# Patient Record
Sex: Male | Born: 1945 | Race: Black or African American | Hispanic: No | Marital: Single | Smoking: Current every day smoker
Health system: Southern US, Community
[De-identification: ages and names within clinical notes are randomized; demographics above are authoritative.]

## PROBLEM LIST (undated history)

## (undated) DIAGNOSIS — I6529 Occlusion and stenosis of unspecified carotid artery: Secondary | ICD-10-CM

## (undated) DIAGNOSIS — E785 Hyperlipidemia, unspecified: Secondary | ICD-10-CM

## (undated) DIAGNOSIS — I639 Cerebral infarction, unspecified: Secondary | ICD-10-CM

## (undated) DIAGNOSIS — I10 Essential (primary) hypertension: Secondary | ICD-10-CM

## (undated) HISTORY — DX: Hyperlipidemia, unspecified: E78.5

## (undated) HISTORY — DX: Essential (primary) hypertension: I10

## (undated) HISTORY — DX: Occlusion and stenosis of unspecified carotid artery: I65.29

## (undated) HISTORY — DX: Cerebral infarction, unspecified: I63.9

---

## 2000-08-22 ENCOUNTER — Emergency Department (HOSPITAL_COMMUNITY): Admission: EM | Admit: 2000-08-22 | Discharge: 2000-08-22 | Payer: Self-pay | Admitting: Emergency Medicine

## 2000-09-05 ENCOUNTER — Ambulatory Visit (HOSPITAL_COMMUNITY): Admission: RE | Admit: 2000-09-05 | Discharge: 2000-09-05 | Payer: Self-pay | Admitting: Surgery

## 2000-09-05 ENCOUNTER — Encounter (INDEPENDENT_AMBULATORY_CARE_PROVIDER_SITE_OTHER): Payer: Self-pay | Admitting: Specialist

## 2002-12-14 ENCOUNTER — Emergency Department (HOSPITAL_COMMUNITY): Admission: EM | Admit: 2002-12-14 | Discharge: 2002-12-14 | Payer: Self-pay | Admitting: *Deleted

## 2003-12-04 ENCOUNTER — Emergency Department (HOSPITAL_COMMUNITY): Admission: EM | Admit: 2003-12-04 | Discharge: 2003-12-04 | Payer: Self-pay | Admitting: Emergency Medicine

## 2004-03-13 ENCOUNTER — Inpatient Hospital Stay (HOSPITAL_COMMUNITY): Admission: AC | Admit: 2004-03-13 | Discharge: 2004-03-14 | Payer: Self-pay

## 2005-07-07 ENCOUNTER — Observation Stay (HOSPITAL_COMMUNITY): Admission: EM | Admit: 2005-07-07 | Discharge: 2005-07-08 | Payer: Self-pay | Admitting: Emergency Medicine

## 2005-07-07 ENCOUNTER — Ambulatory Visit: Payer: Self-pay | Admitting: Cardiology

## 2005-07-07 ENCOUNTER — Ambulatory Visit: Payer: Self-pay | Admitting: Internal Medicine

## 2005-07-07 ENCOUNTER — Encounter: Payer: Self-pay | Admitting: Cardiology

## 2007-09-19 HISTORY — PX: HERNIA REPAIR: SHX51

## 2008-08-03 ENCOUNTER — Emergency Department (HOSPITAL_COMMUNITY): Admission: EM | Admit: 2008-08-03 | Discharge: 2008-08-03 | Payer: Self-pay | Admitting: Emergency Medicine

## 2009-09-18 DIAGNOSIS — I639 Cerebral infarction, unspecified: Secondary | ICD-10-CM

## 2009-09-18 HISTORY — DX: Cerebral infarction, unspecified: I63.9

## 2010-02-16 DIAGNOSIS — I639 Cerebral infarction, unspecified: Secondary | ICD-10-CM

## 2010-02-16 HISTORY — DX: Cerebral infarction, unspecified: I63.9

## 2010-02-25 ENCOUNTER — Ambulatory Visit: Payer: Self-pay | Admitting: Cardiology

## 2010-02-25 ENCOUNTER — Observation Stay (HOSPITAL_COMMUNITY): Admission: EM | Admit: 2010-02-25 | Discharge: 2010-02-26 | Payer: Self-pay | Admitting: Emergency Medicine

## 2010-02-25 ENCOUNTER — Ambulatory Visit: Payer: Self-pay | Admitting: Vascular Surgery

## 2010-02-25 ENCOUNTER — Encounter (INDEPENDENT_AMBULATORY_CARE_PROVIDER_SITE_OTHER): Payer: Self-pay | Admitting: Internal Medicine

## 2010-02-25 LAB — CONVERTED CEMR LAB
Cholesterol: 177 mg/dL
Glucose, Bld: 131 mg/dL
HDL: 30 mg/dL
Hgb A1c MFr Bld: 6.2 %
LDL Cholesterol: 133 mg/dL
TSH: 1.602 microintl units/mL
Total CHOL/HDL Ratio: 5.9
Triglycerides: 72 mg/dL
VLDL: 14 mg/dL

## 2010-03-02 ENCOUNTER — Encounter: Payer: Self-pay | Admitting: Vascular Surgery

## 2010-03-02 ENCOUNTER — Inpatient Hospital Stay (HOSPITAL_COMMUNITY)
Admission: RE | Admit: 2010-03-02 | Discharge: 2010-03-03 | Payer: Self-pay | Source: Home / Self Care | Admitting: Vascular Surgery

## 2010-03-02 DIAGNOSIS — I6529 Occlusion and stenosis of unspecified carotid artery: Secondary | ICD-10-CM

## 2010-03-02 HISTORY — DX: Occlusion and stenosis of unspecified carotid artery: I65.29

## 2010-03-02 HISTORY — PX: CAROTID ENDARTERECTOMY: SUR193

## 2010-03-03 LAB — CONVERTED CEMR LAB
BUN: 5 mg/dL
CO2: 24 meq/L
Calcium: 8.5 mg/dL
Chloride: 108 meq/L
Creatinine, Ser: 0.63 mg/dL
Glucose, Bld: 149 mg/dL
Potassium: 4.1 meq/L
Sodium: 138 meq/L

## 2010-03-14 ENCOUNTER — Ambulatory Visit: Payer: Self-pay | Admitting: Internal Medicine

## 2010-03-14 DIAGNOSIS — F17200 Nicotine dependence, unspecified, uncomplicated: Secondary | ICD-10-CM

## 2010-03-15 ENCOUNTER — Encounter (INDEPENDENT_AMBULATORY_CARE_PROVIDER_SITE_OTHER): Payer: Self-pay | Admitting: Nurse Practitioner

## 2010-03-31 ENCOUNTER — Ambulatory Visit: Payer: Self-pay | Admitting: Vascular Surgery

## 2010-04-14 ENCOUNTER — Telehealth (INDEPENDENT_AMBULATORY_CARE_PROVIDER_SITE_OTHER): Payer: Self-pay | Admitting: Nurse Practitioner

## 2010-05-16 ENCOUNTER — Ambulatory Visit: Payer: Self-pay | Admitting: Physician Assistant

## 2010-05-16 DIAGNOSIS — E739 Lactose intolerance, unspecified: Secondary | ICD-10-CM

## 2010-05-16 DIAGNOSIS — I10 Essential (primary) hypertension: Secondary | ICD-10-CM | POA: Insufficient documentation

## 2010-05-17 DIAGNOSIS — E785 Hyperlipidemia, unspecified: Secondary | ICD-10-CM

## 2010-05-17 LAB — CONVERTED CEMR LAB
ALT: 18 units/L (ref 0–53)
AST: 26 units/L (ref 0–37)
Albumin: 3.8 g/dL (ref 3.5–5.2)
Alkaline Phosphatase: 85 units/L (ref 39–117)
BUN: 8 mg/dL (ref 6–23)
CO2: 25 meq/L (ref 19–32)
Calcium: 8.9 mg/dL (ref 8.4–10.5)
Chloride: 104 meq/L (ref 96–112)
Cholesterol: 151 mg/dL (ref 0–200)
Creatinine, Ser: 0.78 mg/dL (ref 0.40–1.50)
Glucose, Bld: 91 mg/dL (ref 70–99)
HDL: 38 mg/dL — ABNORMAL LOW (ref >39)
LDL Cholesterol: 90 mg/dL (ref 0–99)
Potassium: 4.2 meq/L (ref 3.5–5.3)
Sodium: 139 meq/L (ref 135–145)
Total Bilirubin: 0.4 mg/dL (ref 0.3–1.2)
Total CHOL/HDL Ratio: 4
Total Protein: 7.7 g/dL (ref 6.0–8.3)
Triglycerides: 114 mg/dL (ref <150)
VLDL: 23 mg/dL (ref 0–40)

## 2010-05-20 ENCOUNTER — Encounter (INDEPENDENT_AMBULATORY_CARE_PROVIDER_SITE_OTHER): Payer: Self-pay | Admitting: *Deleted

## 2010-05-20 ENCOUNTER — Encounter: Payer: Self-pay | Admitting: Physician Assistant

## 2010-05-24 ENCOUNTER — Encounter: Payer: Self-pay | Admitting: Physician Assistant

## 2010-05-24 ENCOUNTER — Ambulatory Visit: Payer: Self-pay | Admitting: Nurse Practitioner

## 2010-06-16 ENCOUNTER — Ambulatory Visit: Payer: Self-pay | Admitting: Vascular Surgery

## 2010-08-22 ENCOUNTER — Telehealth (INDEPENDENT_AMBULATORY_CARE_PROVIDER_SITE_OTHER): Payer: Self-pay | Admitting: Nurse Practitioner

## 2010-08-22 DIAGNOSIS — K029 Dental caries, unspecified: Secondary | ICD-10-CM | POA: Insufficient documentation

## 2010-08-24 ENCOUNTER — Encounter (INDEPENDENT_AMBULATORY_CARE_PROVIDER_SITE_OTHER): Payer: Self-pay | Admitting: Nurse Practitioner

## 2010-09-26 ENCOUNTER — Ambulatory Visit: Admit: 2010-09-26 | Payer: Self-pay | Admitting: Nurse Practitioner

## 2010-09-28 ENCOUNTER — Ambulatory Visit: Admit: 2010-09-28 | Payer: Self-pay | Admitting: Vascular Surgery

## 2010-09-28 ENCOUNTER — Ambulatory Visit
Admission: RE | Admit: 2010-09-28 | Discharge: 2010-09-28 | Payer: Self-pay | Source: Home / Self Care | Attending: Vascular Surgery | Admitting: Vascular Surgery

## 2010-10-08 NOTE — Procedures (Unsigned)
CAROTID DUPLEX EXAM  INDICATION:  Follow up carotid endarterectomy.  HISTORY: Diabetes:  No. Cardiac:  No. Hypertension:  Yes. Smoking:  Yes. Previous Surgery:  Right carotid endarterectomy with Dacron patch angioplasty, 03/02/2010. CV History:  CVA with left upper extremity weakness. Amaurosis Fugax No, Paresthesias No, Hemiparesis No.                                      RIGHT             LEFT Brachial systolic pressure:         162               167 Brachial Doppler waveforms:         Normal            Normal Vertebral direction of flow:        Antegrade         Antegrade DUPLEX VELOCITIES (cm/sec) CCA peak systolic                   93                107 ECA peak systolic                   109               103 ICA peak systolic                   105               78 ICA end diastolic                   37                37 PLAQUE MORPHOLOGY:                  Heterogenous      Heterogenous PLAQUE AMOUNT:                      Minimal           Minimal PLAQUE LOCATION:                    CCA               Bifurcation, ICA  IMPRESSION: 1. Right internal carotid artery shows no evidence of restenosis,     status post carotid endarterectomy. 2. Left internal carotid artery velocities suggest 1% to 39% stenosis. 3. Antegrade vertebral arteries bilaterally.  ___________________________________________ Di Kindle. Edilia Bo, M.D.  EM/MEDQ  D:  09/28/2010  T:  09/28/2010  Job:  [id number]

## 2010-10-14 ENCOUNTER — Encounter (INDEPENDENT_AMBULATORY_CARE_PROVIDER_SITE_OTHER): Payer: Self-pay | Admitting: Nurse Practitioner

## 2010-10-18 NOTE — Letter (Signed)
Summary: NUTRITION SUMMARY///SUSIE  NUTRITION SUMMARY///SUSIE   Imported By: Arta Bruce 06/13/2010 15:47:53  _____________________________________________________________________  External Attachment:    Type:   Image     Comment:   External Document

## 2010-10-18 NOTE — Letter (Signed)
Summary: FAXED REQUESTED RECORDS TO [facility]  FAXED REQUESTED RECORDS TO [facility]   Imported By: Arta Bruce 05/20/2010 12:04:03  _____________________________________________________________________  External Attachment:    Type:   Image     Comment:   External Document

## 2010-10-18 NOTE — Letter (Signed)
Summary: *HSN Results Follow up  [facility]  [address] [place], [place] [address]   Phone: [phone]  Fax: [phone]      05/20/2010   Jeremiah Garcia [address] [place], [place]  [address]   Dear  Mr. Jeremiah Garcia,                            ____S.Drinkard,FNP   ____D. Gore,FNP       ____B. McPherson,MD   ____V. Rankins,MD    ____E. Mulberry,MD    ____N. Daphine Deutscher, FNP  ____D. Reche Dixon, MD    ____K. Philipp Deputy, MD    ____Other     This letter is to inform you that your recent test(s):  _______Pap Smear    _______Lab Test     _______X-ray    _______ is within acceptable limits  _______ requires a medication change  _______ requires a follow-up lab visit  _______ requires a follow-up visit with your provider   Comments:  We have been trying to contact you at [phone].  Please contact the office at your earliest convenience.       _________________________________________________________ If you have any questions, please contact our office                     Sincerely,  Levon Hedger [facility]

## 2010-10-18 NOTE — Assessment & Plan Note (Signed)
Summary: 2 MONTH FU////KT   Vital Signs:  Patient profile:   65 year old male Height:      72.25 inches Weight:      196.9 pounds Temp:     98.1 degrees F oral Pulse rate:   80 / minute Pulse rhythm:   regular Resp:     28 per minute BP sitting:   160 / 90  (left arm) Cuff size:   regular  Vitals Entered By: Michelle Nasuti (May 16, 2010 9:09 AM) CC: follow-up visit, Hypertension Management Pain Assessment Patient in pain? no       Does patient need assistance? Functional Status Self care Ambulation Normal   Primary Care Provider:  Lehman Prom, NP  CC:  follow-up visit and Hypertension Management.  History of Present Illness: Here for follow up appt.  Dorma Russell, NP in June after admxn to [facility] for stroke and carotid stenosis.  Had Right CEA done.  NO LICA stenosis on ultrasound.  Has seen Dr. Edilia Bo.  Goes back in December for f/u carotid dopplers.  Social:  Was a Designer, fashion/clothing.  Told by surgeon to stop working.  Now applying for disability.  CVA:  Still has left hand weakness.  Notes right leg nubmness on lat aspect since CVA.  NO leg weakness.    Hypertension History:      He denies chest pain, dyspnea with exertion, and syncope.        Positive major cardiovascular risk factors include male age 49 years old or older, hyperlipidemia, hypertension, and current tobacco user.     Habits & Providers  Alcohol-Tobacco-Diet     Tobacco Status: current     Cigarette Packs/Day: 0.5  Current Medications (verified): 1)  Aspir-Low 81 Mg Tbec (Aspirin) .Marland Kitchen.. 1 By Mouth Once Daily 2)  Crestor 20 Mg Tabs (Rosuvastatin Calcium) .... One Tablet By Mouth Nightly For Cholesterol 3)  Nicotine 14 Mg/24hr Pt24 (Nicotine) .... Apply Daily To Quit Smoking  Allergies (verified): No Known Drug Allergies  Physical Exam  General:  alert, well-developed, and well-nourished.   Head:  normocephalic and atraumatic.   Neck:  supple.   Lungs:  normal breath sounds.   Heart:  normal  rate and regular rhythm.   Abdomen:  soft, non-tender, and no hepatomegaly.   Neurologic:  alert & oriented X3 and cranial nerves II-XII intact.   Psych:  normally interactive.     Impression & Recommendations:  Problem # 1:  ESSENTIAL HYPERTENSION, BENIGN (ICD-401.1)  needs treatment creatinines in hosp were ok will start Lisinopril 20 mg once daily  check BP and BMET in 2 weeks  His updated medication list for this problem includes:    Lisinopril 20 Mg Tabs (Lisinopril) .Marland Kitchen... Take 1 tablet by mouth once a day for blood pressure  Orders: T-Comprehensive Metabolic Panel ([id number])  Problem # 2:  GLUCOSE INTOLERANCE (ICD-271.3) Hgb A1C 6.2 in the hospital refer to Drucilla Schmidt for diet education  Problem # 3:  HYPERLIPIDEMIA (ICD-272.4)  His updated medication list for this problem includes:    Crestor 20 Mg Tabs (Rosuvastatin calcium) ..... One tablet by mouth nightly for cholesterol  Orders: T-Comprehensive Metabolic Panel [id number]) T-Lipid Profile ([id number])  Problem # 4:  CAROTID ARTERY STENOSIS, RIGHT (ICD-433.10) f/u with vasc surgery cont ASA  His updated medication list for this problem includes:    Aspir-low 81 Mg Tbec (Aspirin) .Marland Kitchen... 1 by mouth once daily  Complete Medication List: 1)  Aspir-low 81 Mg Tbec (Aspirin) .Marland KitchenMarland KitchenMarland Kitchen 1  by mouth once daily 2)  Crestor 20 Mg Tabs (Rosuvastatin calcium) .... One tablet by mouth nightly for cholesterol 3)  Nicotine 14 Mg/24hr Pt24 (Nicotine) .... Apply daily to quit smoking 4)  Lisinopril 20 Mg Tabs (Lisinopril) .... Take 1 tablet by mouth once a day for blood pressure  Hypertension Assessment/Plan:      The patient's hypertensive risk group is category B: At least one risk factor (excluding diabetes) with no target organ damage.  His calculated 10 year risk of coronary heart disease is 47 %.  Today's blood pressure is 160/90.    Patient Instructions: 1)  Schedule appt with Susie Piper for diet education. 2)   Return to nurse in 2 weeks for blood pressure check and BMET.  Notify provider if BP > 140/90 or < 110/60. 3)  Please schedule a follow-up appointment in 2 months with [facility] for blood pressure.  Prescriptions: LISINOPRIL 20 MG TABS (LISINOPRIL) Take 1 tablet by mouth once a day for blood pressure  #30 x 5   Entered and Authorized by:   Tereso Newcomer PA-C   Signed by:   Tereso Newcomer PA-C on 05/16/2010   Method used:   Print then Give to Patient   RxID:   [id number] CRESTOR 20 MG TABS (ROSUVASTATIN CALCIUM) One tablet by mouth nightly for cholesterol  #30 x 5   Entered and Authorized by:   Tereso Newcomer PA-C   Signed by:   Tereso Newcomer PA-C on 05/16/2010   Method used:   Faxed to ...       [facility] (retail)       [address] [place], [place]  [address]       Ph: [phone]       Fax: [phone]   RxID:   [id number]

## 2010-10-18 NOTE — Letter (Signed)
Summary: PT INFORMATION SHEET  PT INFORMATION SHEET   Imported By: Arta Bruce 04/04/2010 10:24:15  _____________________________________________________________________  External Attachment:    Type:   Image     Comment:   External Document
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
## 2010-10-18 NOTE — Progress Notes (Signed)
Summary: DENTAL PAIN/REFERRAL  Phone Note Call from Patient Call back at Home Phone [phone]   Reason for Call: Referral Summary of Call: [facility] PT. MR Witting STOPPED BY  TO GET A DENTAL REFERRAL, BECAUSE HE IS HAVING DENTAL PAIN AND HAVE ABOUT 3 TEETH THAT NEED TO BE PULLED/ Initial call taken by: Leodis Rains,  August 22, 2010 10:18 AM  Follow-up for Phone Call        forward to N. Daphine Deutscher, fnp Follow-up by: Levon Hedger,  August 23, 2010 4:20 PM  Additional Follow-up for Phone Call Additional follow up Details #1::        will refer to dental clinic (referral given to [facility]) advise pt that # on referral is the contact number that they will contact him with time/date of the appointment advise pt that there is a wait list so be advised Additional Follow-up by: Lehman Prom FNP,  August 23, 2010 4:26 PM  New Problems: DENTAL CARIES (ICD-521.00)   Additional Follow-up for Phone Call Additional follow up Details #2::    Nanci Pina REFERRAL TO [facility] DENTAL  [facility] # [id number] ADDRESS [address]  THEY WILL CONTACT THE PT TO MADE AN APPT Follow-up by: Cheryll Dessert,  August 24, 2010 10:49 AM  New Problems: DENTAL CARIES (ICD-521.00)

## 2010-10-18 NOTE — Progress Notes (Signed)
Summary: Requesting for Crestor Samples  Phone Note Call from Patient Call back at [phone]   Summary of Call: The pt had an appointment on Aug 23 to apply for elegibility but at the meantime he is wondered if the provider can provide some samples of crestor that can last until then.  Pt also applied for medicaid but he needs to wait for 45 days to get a response back.  The medication cost 80 at regular pharmacy without a card.  Please call back the pt.   ([facility] [place] [place] [facility]   Initial call taken by: Manon Hilding,  April 14, 2010 10:11 AM  Follow-up for Phone Call        Sent to N. Daphine Deutscher.  Dutch Quint RN  April 14, 2010 11:06 AM   Additional Follow-up for Phone Call Additional follow up Details #1::        ok to get crestor samples #28 document in chart Additional Follow-up by: Lehman Prom FNP,  April 14, 2010 11:35 AM    Additional Follow-up for Phone Call Additional follow up Details #2::    Unable to leave message -- no answer.  Dutch Quint RN  April 14, 2010 2:20 PM  Left message on answering machine to return call.  Dutch Quint RN  April 15, 2010 10:36 AM  Pt. received sample Crestor #28 pills.   Follow-up by: Dutch Quint RN,  April 18, 2010 10:27 AM

## 2010-10-20 NOTE — Letter (Signed)
Summary: MAILED REQUESTED RECORDS TO [facility]  MAILED REQUESTED RECORDS TO [facility]   Imported By: Arta Bruce 10/14/2010 09:44:35  _____________________________________________________________________  External Attachment:    Type:   Image     Comment:   External Document

## 2010-10-20 NOTE — Letter (Signed)
Summary: DENTAL REFERRAL  DENTAL REFERRAL   Imported By: Arta Bruce 08/30/2010 14:12:36  _____________________________________________________________________  External Attachment:    Type:   Image     Comment:   External Document

## 2010-11-16 ENCOUNTER — Encounter: Payer: Self-pay | Admitting: Nurse Practitioner

## 2010-11-16 ENCOUNTER — Encounter (INDEPENDENT_AMBULATORY_CARE_PROVIDER_SITE_OTHER): Payer: Self-pay | Admitting: Nurse Practitioner

## 2010-11-16 DIAGNOSIS — N3000 Acute cystitis without hematuria: Secondary | ICD-10-CM | POA: Insufficient documentation

## 2010-11-16 DIAGNOSIS — R05 Cough: Secondary | ICD-10-CM | POA: Insufficient documentation

## 2010-11-16 DIAGNOSIS — R059 Cough, unspecified: Secondary | ICD-10-CM | POA: Insufficient documentation

## 2010-11-16 LAB — CONVERTED CEMR LAB
ALT: 15 units/L (ref 0–53)
AST: 24 units/L (ref 0–37)
Albumin: 3.9 g/dL (ref 3.5–5.2)
Alkaline Phosphatase: 71 units/L (ref 39–117)
BUN: 9 mg/dL (ref 6–23)
Basophils Absolute: 0 10*3/uL (ref 0.0–0.1)
Basophils Relative: 0 % (ref 0–1)
Blood Glucose, Fingerstick: 193
Blood in Urine, dipstick: NEGATIVE
CO2: 25 meq/L (ref 19–32)
Calcium: 9.3 mg/dL (ref 8.4–10.5)
Chloride: 106 meq/L (ref 96–112)
Cholesterol, target level: 200 mg/dL
Creatinine, Ser: 0.79 mg/dL (ref 0.40–1.50)
Eosinophils Absolute: 0.2 10*3/uL (ref 0.0–0.7)
Eosinophils Relative: 4 % (ref 0–5)
Glucose, Bld: 108 mg/dL — ABNORMAL HIGH (ref 70–99)
Glucose, Urine, Semiquant: NEGATIVE
HCT: 44.4 % (ref 39.0–52.0)
HDL goal, serum: 40 mg/dL
Hemoglobin: 14.6 g/dL (ref 13.0–17.0)
Hgb A1c MFr Bld: 5.4 %
LDL Goal: 130 mg/dL
Lymphocytes Relative: 42 % (ref 12–46)
Lymphs Abs: 2.3 10*3/uL (ref 0.7–4.0)
MCHC: 32.9 g/dL (ref 30.0–36.0)
MCV: 86.2 fL (ref 78.0–100.0)
Microalb, Ur: 14.48 mg/dL — ABNORMAL HIGH (ref 0.00–1.89)
Monocytes Absolute: 0.6 10*3/uL (ref 0.1–1.0)
Monocytes Relative: 10 % (ref 3–12)
Neutro Abs: 2.4 10*3/uL (ref 1.7–7.7)
Neutrophils Relative %: 44 % (ref 43–77)
Nitrite: POSITIVE
OCCULT 1: NEGATIVE
PSA: 3.25 ng/mL (ref <=4.00)
Platelets: 292 10*3/uL (ref 150–400)
Potassium: 4.2 meq/L (ref 3.5–5.3)
Protein, U semiquant: NEGATIVE
RBC: 5.15 M/uL (ref 4.22–5.81)
RDW: 14.9 % (ref 11.5–15.5)
Sodium: 140 meq/L (ref 135–145)
Total Bilirubin: 0.4 mg/dL (ref 0.3–1.2)
Total Protein: 7.6 g/dL (ref 6.0–8.3)
Urobilinogen, UA: 0.2
WBC Urine, dipstick: NEGATIVE
WBC: 5.5 10*3/uL (ref 4.0–10.5)
pH: 5

## 2010-11-17 ENCOUNTER — Other Ambulatory Visit (HOSPITAL_COMMUNITY): Payer: Self-pay | Admitting: Internal Medicine

## 2010-11-17 ENCOUNTER — Telehealth (INDEPENDENT_AMBULATORY_CARE_PROVIDER_SITE_OTHER): Payer: Self-pay | Admitting: Nurse Practitioner

## 2010-11-17 ENCOUNTER — Encounter (INDEPENDENT_AMBULATORY_CARE_PROVIDER_SITE_OTHER): Payer: Self-pay | Admitting: Nurse Practitioner

## 2010-11-17 DIAGNOSIS — F17200 Nicotine dependence, unspecified, uncomplicated: Secondary | ICD-10-CM

## 2010-11-17 DIAGNOSIS — Z77090 Contact with and (suspected) exposure to asbestos: Secondary | ICD-10-CM

## 2010-11-17 DIAGNOSIS — R05 Cough: Secondary | ICD-10-CM

## 2010-11-18 ENCOUNTER — Ambulatory Visit (HOSPITAL_COMMUNITY)
Admission: RE | Admit: 2010-11-18 | Discharge: 2010-11-18 | Disposition: A | Discharge status: Home / Self Care | Payer: Medicare Other | Source: Ambulatory Visit | Attending: Internal Medicine | Admitting: Internal Medicine

## 2010-11-18 ENCOUNTER — Other Ambulatory Visit (HOSPITAL_COMMUNITY): Payer: Self-pay | Admitting: Internal Medicine

## 2010-11-18 DIAGNOSIS — R05 Cough: Secondary | ICD-10-CM

## 2010-11-18 DIAGNOSIS — J438 Other emphysema: Secondary | ICD-10-CM | POA: Insufficient documentation

## 2010-11-18 DIAGNOSIS — Z77090 Contact with and (suspected) exposure to asbestos: Secondary | ICD-10-CM | POA: Insufficient documentation

## 2010-11-18 DIAGNOSIS — R059 Cough, unspecified: Secondary | ICD-10-CM | POA: Insufficient documentation

## 2010-11-18 DIAGNOSIS — F17200 Nicotine dependence, unspecified, uncomplicated: Secondary | ICD-10-CM

## 2010-11-18 MED ORDER — IOHEXOL 300 MG/ML  SOLN
80.0000 mL | Freq: Once | INTRAMUSCULAR | Status: AC | PRN
  Administered 2010-11-18: 80 mL via INTRAVENOUS

## 2010-11-24 NOTE — Letter (Signed)
Summary: TEST ORDER FORM  TEST ORDER FORM   Imported By: Arta Bruce 11/18/2010 12:12:21  _____________________________________________________________________  External Attachment:    Type:   Image     Comment:   External Document

## 2010-11-24 NOTE — Letter (Signed)
Summary: TEST ORDER FORM  TEST ORDER FORM   Imported By: Arta Bruce 11/17/2010 12:04:40  _____________________________________________________________________  External Attachment:    Type:   Image     Comment:   External Document

## 2010-11-24 NOTE — Progress Notes (Signed)
Summary: CT Order  Phone Note Outgoing Call   Summary of Call: Pt scheduled a CT during visit on 11/16/2010 order sheet sent up front  Initial call taken by: Lehman Prom FNP,  November 17, 2010 8:48 AM  Follow-up for Phone Call        Pt  sched for 11-18-10 @ 10:30am. @ [facility].Marland KitchenPt aware of appt.Candi Leash  November 17, 2010 2:13 PM  Follow-up by: Candi Leash,  November 17, 2010 2:13 PM

## 2010-11-24 NOTE — Assessment & Plan Note (Signed)
Summary: HTN   Vital Signs:  Patient profile:   65 year old male Weight:      189.9 pounds BMI:     25.67 Temp:     97.9 degrees F oral Pulse rate:   76 / minute Pulse rhythm:   regular Resp:     20 per minute BP sitting:   130 / 77  (left arm) Cuff size:   regular  Vitals Entered By: Levon Hedger (November 16, 2010 11:00 AM)  Nutrition Counseling: Patient's BMI is greater than 25 and therefore counseled on weight management options. CC: follow-up visit HTN, Hypertension Management, Lipid Management Is Patient Diabetic? No Pain Assessment Patient in pain? no      CBG Result 193  Does patient need assistance? Functional Status Self care Ambulation Normal   Primary Care Provider:  Lehman Prom, NP  CC:  follow-up visit HTN, Hypertension Management, and Lipid Management.  History of Present Illness:  Pt into the office to f/u on htn.  Pt did not bring his medications into the office but he is able to recall.    Hypertension History:      He denies headache, chest pain, and palpitations.  He notes no problems with any antihypertensive medication side effects.  pt is taking meds as ordered.        Positive major cardiovascular risk factors include male age 62 years old or older, hyperlipidemia, hypertension, and current tobacco user.        Further assessment for target organ damage reveals no history of ASHD or stroke/TIA.    Lipid Management History:      Positive NCEP/ATP III risk factors include male age 32 years old or older, HDL cholesterol less than 40, current tobacco user, and hypertension.  Negative NCEP/ATP III risk factors include no ASHD (atherosclerotic heart disease) and no prior stroke/TIA.      Habits & Providers  Alcohol-Tobacco-Diet     Alcohol drinks/day: 0     Tobacco Status: current     Tobacco Counseling: to quit use of tobacco products     Cigarette Packs/Day: 0.5     Year Started: age 1  Exercise-Depression-Behavior     Does  Patient Exercise: no     Have you felt down or hopeless? no     Have you felt little pleasure in things? no     Depression Counseling: not indicated; screening negative for depression     Drug Use: never  Allergies (verified): No Known Drug Allergies  Social History: Does Patient Exercise:  no  Review of Systems General:  Denies loss of appetite. CV:  Denies chest pain or discomfort. 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  Pt has been a chronic smoker. GI:  Denies abdominal pain, nausea, and vomiting. GU:  Denies dysuria and hematuria.  Physical Exam  General:  alert.   Head:  normocephalic.   Mouth:  some teeth missing.   Lungs:  normal breath sounds.   Heart:  normal rate and regular rhythm.   Abdomen:  normal bowel sounds.   Prostate:  no gland enlargement.   Msk:  up to the exam Neurologic:  alert & oriented X3.   Skin:  color normal.   Psych:  Oriented X3.     Impression & Recommendations:  Problem # 1:  ESSENTIAL HYPERTENSION, BENIGN (ICD-401.1)  BP is doing well DASH diet pt  to continue current meds His updated medication list for this problem includes:    Lisinopril 20 Mg Tabs (Lisinopril) .Marland Kitchen... Take 1 tablet by mouth once a day for blood pressure  Orders: T-CBC w/Diff (04540-98119) T-Comprehensive Metabolic Panel (14782-95621) T-Urine Microalbumin w/creat. ratio [phone]) UA Dipstick w/o Micro (manual) (28413)  Problem # 2:  HYPERLIPIDEMIA (ICD-272.4)  His updated medication list for this problem includes:    Crestor 20 Mg Tabs (Rosuvastatin calcium) ..... One tablet by mouth nightly for cholesterol  Problem # 3:  GLUCOSE INTOLERANCE (ICD-271.3)  Orders: Capillary Blood Glucose/CBG (82948) Hemoglobin A1C (24401)  Problem # 4:  TOBACCO ABUSE (ICD-305.1)  advised pt to quit smoking The following medications were removed from the  medication list:    Nicotine 14 Mg/24hr Pt24 (Nicotine) .Marland Kitchen... Apply daily to quit smoking  Orders: CT with & without Contrast (CT w&w/o Contrast)  Problem # 5:  SPECIAL SCREENING MALIGNANT NEOPLASM OF PROSTATE (ICD-V76.44)  Orders: T-PSA (02725-36644)  Complete Medication List: 1)  Aspir-low 81 Mg Tbec (Aspirin) .Marland Kitchen.. 1 by mouth once daily 2)  Crestor 20 Mg Tabs (Rosuvastatin calcium) .... One tablet by mouth nightly for cholesterol 3)  Lisinopril 20 Mg Tabs (Lisinopril) .... Take 1 tablet by mouth once a day for blood pressure 4)  Fish Oil 1000 Mg Caps (Omega-3 fatty acids) .... Take 1 capsule by mouth once a day 5)  Macrobid 100 Mg Caps (Nitrofurantoin monohyd macro) .... One capsule by mouth two times a day for infection  Other Orders: T-Culture, Urine (03474-25956)  Hypertension Assessment/Plan:      The patient's hypertensive risk group is category B: At least one risk factor (excluding diabetes) with no target organ damage.  His calculated 10 year risk of coronary heart disease is 18 %.  Today's blood pressure is 130/77.  His blood pressure goal is < 140/90.  Lipid Assessment/Plan:      Based on NCEP/ATP III, the patient's risk factor category is "0-1 risk factors".  The patient's lipid goals are as follows: Total cholesterol goal is 200; LDL cholesterol goal is 130; HDL cholesterol goal is 40; Triglyceride goal is 150.    Patient Instructions: 1)  Blood pressure - doing well. Keep taking medications as ordered 2)  Prostate exam - your labs will be checked today and you will be notified of the results 3)  No pneumovax present today in office.  You will need to get on the next visit 4)  You will be scheduled for a Chest CT and called to schedule the appt. 5)  You have infection in your urine. It will be sent to the lab for culture. 6)  Follow up in 6 months for high blood pressure. Prescriptions: MACROBID 100 MG CAPS (NITROFURANTOIN MONOHYD MACRO) One capsule by mouth two times  a day for infection  #14 x 0   Entered and Authorized by:   Lehman Prom FNP   Signed by:   Lehman Prom FNP on 11/16/2010   Method used:   Print then Give to Patient   RxID:   [id number] MACROBID 100 MG CAPS (NITROFURANTOIN MONOHYD MACRO) One capsule by mouth two times a day for infection  #14 x 0   Entered and Authorized by:   Lehman Prom FNP   Signed by:   Lehman Prom FNP on 11/16/2010   Method used:   Print then Give to Patient   RxID:   [id number]    Orders Added: 1)  Capillary Blood Glucose/CBG [57322] 2)  Est.  Patient Level IV [99214] 3)  T-PSA [[address]-60454] 4)  T-CBC w/Diff [09811-91478] 5)  T-Comprehensive Metabolic Panel [80053-22900] 6)  T-Urine Microalbumin w/creat. ratio [82043-[address]] 7)  UA Dipstick w/o Micro (manual) [81002] 8)  Hemoglobin A1C [83036] 9)  T-Culture, Urine [29562-13086] 10)  CT with & without Contrast [CT w&w/o Contrast]    Prevention & Chronic Care Immunizations   Influenza vaccine: Not documented   Influenza vaccine deferral: Refused  (11/16/2010)    Tetanus booster: Not documented    Pneumococcal vaccine: Not documented    H. zoster vaccine: Not documented  Colorectal Screening   Hemoccult: Not documented    Colonoscopy: Not documented  Other Screening   PSA: Not documented   PSA ordered.   Smoking status: current  (11/16/2010)  Lipids   Total Cholesterol: 151  (05/16/2010)   LDL: 90  (05/16/2010)   LDL Direct: Not documented   HDL: 38  (05/16/2010)   Triglycerides: 114  (05/16/2010)    SGOT (AST): 26  (05/16/2010)   SGPT (ALT): 18  (05/16/2010) CMP ordered    Alkaline phosphatase: 85  (05/16/2010)   Total bilirubin: 0.4  (05/16/2010)  Hypertension   Last Blood Pressure: 130 / 77  (11/16/2010)   Serum creatinine: 0.78  (05/16/2010)   Serum potassium 4.2  (05/16/2010) CMP ordered   Self-Management Support :    Hypertension self-management support: Not documented    Lipid  self-management support: Not documented    Laboratory Results   Urine Tests  Date/Time Received: November 16, 2010 11:31 AM   Routine Urinalysis   Color: dark Jeremiah Garcia Glucose: negative   (Normal Range: Negative) Bilirubin: moderate   (Normal Range: Negative) Ketone: small (15)   (Normal Range: Negative) Blood: negative   (Normal Range: Negative) pH: 5.0   (Normal Range: 5.0-8.0) Protein: negative   (Normal Range: Negative) Urobilinogen: 0.2   (Normal Range: 0-1) Nitrite: positive   (Normal Range: Negative) Leukocyte Esterace: negative   (Normal Range: Negative)     Blood Tests   Date/Time Received: November 16, 2010 11:32 AM   HGBA1C: 5.4%   (Normal Range: Non-Diabetic - 3-6%   Control Diabetic - 6-8%) CBG Random:: 193mg /dL  Date/Time Received: November 16, 2010 11:47 AM   Stool - Occult Blood Hemmoccult #1: negative Date: 11/16/2010

## 2010-11-24 NOTE — Letter (Signed)
Summary: *HSN Results Follow up  [facility]  [address] [place], [place] [address]   Phone: [phone]  Fax: [phone]      11/17/2010   Jeremiah Garcia [place]bon [address] [place], [place]  [address]   Dear  Mr. Jeremiah Garcia,                            ____S.Drinkard,FNP   ____D. Gore,FNP       ____B. McPherson,MD   ____V. Rankins,MD    ____E. Mulberry,MD    __X__N. Daphine Deutscher, FNP  ____D. Reche Dixon, MD    ____K. Philipp Deputy, MD    ____Other     This letter is to inform you that your recent test(s):  _______Pap Smear    ___X____Lab Test     _______X-ray    ___X____ is within acceptable limits  _______ requires a medication change  _______ requires a follow-up lab visit  _______ requires a follow-up visit with your provider   Comments: Labs ok during recent office visit except high blood pressure over time has caused your kidneys to work hard.  Be sure to take your blood pressure medications as ordered to prevent further problems.      _________________________________________________________ If you have any questions, please contact our office [phone]                    Sincerely,    Lehman Prom FNP [facility]

## 2010-11-25 ENCOUNTER — Telehealth (INDEPENDENT_AMBULATORY_CARE_PROVIDER_SITE_OTHER): Payer: Self-pay | Admitting: Nurse Practitioner

## 2010-11-29 NOTE — Progress Notes (Signed)
Summary: Scripts for Lisinipril & Crestor  Phone Note Call from Patient   Reason for Call: Talk to Nurse Summary of Call: Scripts written for Lisinopril and Crestor.. call in to [facility] Initial call taken by: Nicholaus Bloom,  November 25, 2010 1:01 PM  Follow-up for Phone Call        Refills completed - pt. notified.  Dutch Quint RN  November 25, 2010 2:10 PM     Prescriptions: LISINOPRIL 20 MG TABS (LISINOPRIL) Take 1 tablet by mouth once a day for blood pressure  #30 x 3   Entered by:   Dutch Quint RN   Authorized by:   Lehman Prom FNP   Signed by:   Dutch Quint RN on 11/25/2010   Method used:   Electronically to        [facility]. [phone]* (retail)       [address] [place], [place]  [address]       Ph: [phone]       Fax: [phone]   RxID:   [id number] CRESTOR 20 MG TABS (ROSUVASTATIN CALCIUM) One tablet by mouth nightly for cholesterol  #30 x 3   Entered by:   Dutch Quint RN   Authorized by:   Lehman Prom FNP   Signed by:   Dutch Quint RN on 11/25/2010   Method used:   Electronically to        [facility]. [phone]* (retail)       [address] [place], [place]  [address]       Ph: [phone]       Fax: [phone]   RxID:   [id number]

## 2010-12-01 ENCOUNTER — Telehealth (INDEPENDENT_AMBULATORY_CARE_PROVIDER_SITE_OTHER): Payer: Self-pay | Admitting: Nurse Practitioner

## 2010-12-04 LAB — COMPREHENSIVE METABOLIC PANEL
ALT: 20 U/L (ref 0–53)
AST: 26 U/L (ref 0–37)
Albumin: 3.1 g/dL — ABNORMAL LOW (ref 3.5–5.2)
Alkaline Phosphatase: 81 U/L (ref 39–117)
BUN: 11 mg/dL (ref 6–23)
CO2: 24 mEq/L (ref 19–32)
Calcium: 8.7 mg/dL (ref 8.4–10.5)
Chloride: 106 mEq/L (ref 96–112)
Creatinine, Ser: 0.82 mg/dL (ref 0.4–1.5)
GFR calc Af Amer: >60 mL/min (ref >60)
GFR calc non Af Amer: >60 mL/min (ref >60)
Glucose, Bld: 116 mg/dL — ABNORMAL HIGH (ref 70–99)
Potassium: 3.9 mEq/L (ref 3.5–5.1)
Sodium: 136 mEq/L (ref 135–145)
Total Bilirubin: 0.5 mg/dL (ref 0.3–1.2)
Total Protein: 7.5 g/dL (ref 6.0–8.3)

## 2010-12-04 LAB — BASIC METABOLIC PANEL
BUN: 5 mg/dL — ABNORMAL LOW (ref 6–23)
CO2: 24 mEq/L (ref 19–32)
Calcium: 8.5 mg/dL (ref 8.4–10.5)
Chloride: 108 mEq/L (ref 96–112)
Creatinine, Ser: 0.63 mg/dL (ref 0.4–1.5)
GFR calc Af Amer: >60 mL/min (ref >60)
GFR calc non Af Amer: >60 mL/min (ref >60)
Glucose, Bld: 149 mg/dL — ABNORMAL HIGH (ref 70–99)
Potassium: 4.1 mEq/L (ref 3.5–5.1)
Sodium: 138 mEq/L (ref 135–145)

## 2010-12-04 LAB — CBC
HCT: 45.5 % (ref 39.0–52.0)
Hemoglobin: 15.3 g/dL (ref 13.0–17.0)
MCHC: 33.6 g/dL (ref 30.0–36.0)
MCV: 87.8 fL (ref 78.0–100.0)
Platelets: 231 10*3/uL (ref 150–400)
Platelets: 235 10*3/uL (ref 150–400)
RBC: 5.18 MIL/uL (ref 4.22–5.81)
RDW: 15.5 % (ref 11.5–15.5)
RDW: 15.5 % (ref 11.5–15.5)
WBC: 5.4 10*3/uL (ref 4.0–10.5)
WBC: 8.9 10*3/uL (ref 4.0–10.5)

## 2010-12-04 LAB — TYPE AND SCREEN
ABO/RH(D): A POS
Antibody Screen: NEGATIVE

## 2010-12-04 LAB — URINALYSIS, ROUTINE W REFLEX MICROSCOPIC
Bilirubin Urine: NEGATIVE
Glucose, UA: NEGATIVE mg/dL
Hgb urine dipstick: NEGATIVE
Ketones, ur: NEGATIVE mg/dL
Nitrite: NEGATIVE
Protein, ur: NEGATIVE mg/dL
Specific Gravity, Urine: 1.028 (ref 1.005–1.030)
Urobilinogen, UA: 1 mg/dL (ref 0.0–1.0)
pH: 5 (ref 5.0–8.0)

## 2010-12-04 LAB — PROTIME-INR
INR: 1 (ref 0.00–1.49)
Prothrombin Time: 13.1 seconds (ref 11.6–15.2)

## 2010-12-04 LAB — APTT: aPTT: 30 seconds (ref 24–37)

## 2010-12-04 LAB — MRSA PCR SCREENING: MRSA by PCR: NEGATIVE

## 2010-12-04 LAB — ABO/RH: ABO/RH(D): A POS

## 2010-12-05 LAB — COMPREHENSIVE METABOLIC PANEL
ALT: 17 U/L (ref 0–53)
AST: 23 U/L (ref 0–37)
Alkaline Phosphatase: 82 U/L (ref 39–117)
CO2: 27 mEq/L (ref 19–32)
Calcium: 8.4 mg/dL (ref 8.4–10.5)
Chloride: 106 mEq/L (ref 96–112)
GFR calc Af Amer: >60 mL/min (ref >60)
GFR calc non Af Amer: >60 mL/min (ref >60)
Potassium: 3.4 mEq/L — ABNORMAL LOW (ref 3.5–5.1)
Sodium: 138 mEq/L (ref 135–145)

## 2010-12-05 LAB — SEDIMENTATION RATE: Sed Rate: 24 mm/hr — ABNORMAL HIGH (ref 0–16)

## 2010-12-05 LAB — HEMOGLOBIN A1C
Hgb A1c MFr Bld: 6.2 % — ABNORMAL HIGH (ref <5.7)
Mean Plasma Glucose: 131 mg/dL — ABNORMAL HIGH (ref <117)

## 2010-12-05 LAB — GLUCOSE, CAPILLARY: Glucose-Capillary: 118 mg/dL — ABNORMAL HIGH (ref 70–99)

## 2010-12-05 LAB — CBC
HCT: 41.3 % (ref 39.0–52.0)
Hemoglobin: 13.6 g/dL (ref 13.0–17.0)
MCHC: 32.8 g/dL (ref 30.0–36.0)
MCV: 87 fL (ref 78.0–100.0)
Platelets: 237 10*3/uL (ref 150–400)
RBC: 4.75 MIL/uL (ref 4.22–5.81)
RDW: 15.5 % (ref 11.5–15.5)
WBC: 7.4 10*3/uL (ref 4.0–10.5)

## 2010-12-05 LAB — URINALYSIS, ROUTINE W REFLEX MICROSCOPIC
Nitrite: NEGATIVE
Specific Gravity, Urine: 1.027 (ref 1.005–1.030)
Urobilinogen, UA: 4 mg/dL — ABNORMAL HIGH (ref 0.0–1.0)
pH: 5.5 (ref 5.0–8.0)

## 2010-12-05 LAB — DIFFERENTIAL
Basophils Absolute: 0 10*3/uL (ref 0.0–0.1)
Basophils Relative: 1 % (ref 0–1)
Eosinophils Absolute: 0.2 10*3/uL (ref 0.0–0.7)
Eosinophils Relative: 2 % (ref 0–5)
Lymphocytes Relative: 35 % (ref 12–46)
Lymphs Abs: 2.6 10*3/uL (ref 0.7–4.0)
Monocytes Absolute: 1 10*3/uL (ref 0.1–1.0)
Monocytes Relative: 14 % — ABNORMAL HIGH (ref 3–12)
Neutro Abs: 3.6 10*3/uL (ref 1.7–7.7)
Neutrophils Relative %: 48 % (ref 43–77)

## 2010-12-05 LAB — CARDIAC PANEL(CRET KIN+CKTOT+MB+TROPI)
CK, MB: 1.9 ng/mL (ref 0.3–4.0)
Relative Index: 1.3 (ref 0.0–2.5)
Total CK: 148 U/L (ref 7–232)
Troponin I: <0.01 ng/mL (ref 0.00–0.06)

## 2010-12-05 LAB — FOLATE RBC: RBC Folate: 357 ng/mL (ref 180–600)

## 2010-12-05 LAB — VITAMIN B12: Vitamin B-12: 530 pg/mL (ref 211–911)

## 2010-12-05 LAB — LIPID PANEL
LDL Cholesterol: 133 mg/dL — ABNORMAL HIGH (ref 0–99)
Total CHOL/HDL Ratio: 5.9 RATIO
Triglycerides: 72 mg/dL (ref <150)
VLDL: 14 mg/dL (ref 0–40)

## 2010-12-05 LAB — TSH: TSH: 1.602 u[IU]/mL (ref 0.350–4.500)

## 2010-12-05 LAB — PROTIME-INR: Prothrombin Time: 13.6 seconds (ref 11.6–15.2)

## 2010-12-05 LAB — APTT: aPTT: 31 seconds (ref 24–37)

## 2010-12-09 ENCOUNTER — Encounter (INDEPENDENT_AMBULATORY_CARE_PROVIDER_SITE_OTHER): Payer: Self-pay | Admitting: Nurse Practitioner

## 2010-12-09 ENCOUNTER — Encounter: Payer: Self-pay | Admitting: Nurse Practitioner

## 2010-12-09 DIAGNOSIS — J438 Other emphysema: Secondary | ICD-10-CM | POA: Insufficient documentation

## 2010-12-15 ENCOUNTER — Telehealth (INDEPENDENT_AMBULATORY_CARE_PROVIDER_SITE_OTHER): Payer: Self-pay | Admitting: Nurse Practitioner

## 2010-12-15 NOTE — Assessment & Plan Note (Signed)
Summary: Dental Caries   Vital Signs:  Patient profile:   65 year old male Weight:      192.38 pounds Temp:     98.1 degrees F oral Pulse rate:   78 / minute Pulse rhythm:   regular Resp:     20 per minute BP sitting:   147 / 77  (right arm) Cuff size:   regular  Vitals Entered By: Hale Drone CMA (December 09, 2010 9:56 AM) CC: Needing a dental referral... Has 3 broken teeth... Also needing another Vicodin Rx... He has Medicare so he needs to transfer to [facility] on [address] , Hypertension Management, Lipid Management Is Patient Diabetic? Yes Pain Assessment Patient in pain? yes      CBG Result 96 CBG Device ID Fasting  Does patient need assistance? Functional Status Self care Ambulation Normal   Primary Care Provider:  Lehman Prom, NP  CC:  Needing a dental referral... Has 3 broken teeth... Also needing another Vicodin Rx... He has Medicare so he needs to transfer to [facility] on [address] , Hypertension Management, and Lipid Management.  History of Present Illness:  Pt into the office with c/o dental problems He has several broken teeth - 2 upper and 1 lower that are problematic for the pt. No dental f/u in several years. +pain  +swelling of gums about 1-2 weeks ago  Urinary infection noted on last visit but he has not been able to get the Rx. He has changed pharmacies.  Hypertension History:      He denies headache, chest pain, and palpitations.  He notes no problems with any antihypertensive medication side effects.        Positive major cardiovascular risk factors include male age 25 years old or older, hyperlipidemia, hypertension, and current tobacco user.        Further assessment for target organ damage reveals no history of ASHD or stroke/TIA.    Lipid Management History:      Positive NCEP/ATP III risk factors include male age 37 years old or older, HDL cholesterol less than 40, current tobacco user, and hypertension.  Negative NCEP/ATP  III risk factors include no ASHD (atherosclerotic heart disease) and no prior stroke/TIA.      Current Medications (verified): 1)  Aspir-Low 81 Mg Tbec (Aspirin) .Marland Kitchen.. 1 By Mouth Once Daily 2)  Crestor 20 Mg Tabs (Rosuvastatin Calcium) .... One Tablet By Mouth Nightly For Cholesterol 3)  Lisinopril 20 Mg Tabs (Lisinopril) .... Take 1 Tablet By Mouth Once A Day For Blood Pressure 4)  Fish Oil 1000 Mg Caps (Omega-3 Fatty Acids) .... Take 1 Capsule By Mouth Once A Day 5)  Macrobid 100 Mg Caps (Nitrofurantoin Monohyd Macro) .... One Capsule By Mouth Two Times A Day For Infection  Allergies (verified): No Known Drug Allergies  Review of Systems General:  Denies fever; +dental pain. CV:  Denies chest pain or discomfort. Resp:  Denies cough. GI:  Denies abdominal pain, nausea, and vomiting.  Physical Exam  General:  alert.   Head:  normocephalic.   Mouth:  mulitple caries Lungs:  normal breath sounds.   Heart:  normal rate and regular rhythm.   Abdomen:  normal bowel sounds.   Msk:  normal ROM.   Neurologic:  alert & oriented X3.   Skin:  color normal.   Psych:  Oriented X3.     Impression & Recommendations:  Problem # 1:  DENTAL CARIES (ICD-521.00) advised pt to find dentist to refer  Problem # 2:  EMPHYSEMA (ICD-492.8) noted on recent CT  Problem # 3:  TOBACCO ABUSE (ICD-305.1) advised cessation  Problem # 4:  ESSENTIAL HYPERTENSION, BENIGN (ICD-401.1) BP is still slightly elevated will increase the dose His updated medication list for this problem includes:    Lisinopril 40 Mg Tabs (Lisinopril) ..... One tablet by mouth daily for blood pressure **note increase**  Complete Medication List: 1)  Aspir-low 81 Mg Tbec (Aspirin) .Marland Kitchen.. 1 by mouth once daily 2)  Crestor 20 Mg Tabs (Rosuvastatin calcium) .... One tablet by mouth nightly for cholesterol 3)  Lisinopril 40 Mg Tabs (Lisinopril) .... One tablet by mouth daily for blood pressure **note increase** 4)  Fish Oil 1000  Mg Caps (Omega-3 fatty acids) .... Take 1 capsule by mouth once a day 5)  Macrobid 100 Mg Caps (Nitrofurantoin monohyd macro) .... One capsule by mouth two times a day for infection 6)  Tramadol Hcl 50 Mg Tabs (Tramadol hcl) .... One tablet by mouth two times a day as needed for pain  Other Orders: Capillary Blood Glucose/CBG ([address]) Pneumococcal Vaccine ([address]) Admin 1st Vaccine ([address]11)  Hypertension Assessment/Plan:      The patient's hypertensive risk group is category B: At least one risk factor (excluding diabetes) with no target organ damage.  His calculated 10 year risk of coronary heart disease is 22 %.  Today's blood pressure is 147/77.  His blood pressure goal is < 140/90.  Lipid Assessment/Plan:      Based on NCEP/ATP III, the patient's risk factor category is "2 or more risk factors and a calculated 10 year CAD risk of > 20%".  The patient's lipid goals are as follows: Total cholesterol goal is 200; LDL cholesterol goal is 130; HDL cholesterol goal is 40; Triglyceride goal is 150.    Patient Instructions: 1)  Blood pressure/ Kidneys - Increase the dose of lisinopril to 40mg  by mouth daily. 2)  Urine infection - Prescription has been sent to [facility].  Macrobid 100mg  by mouth two times a day  3)  Dental  - speak with [facility] and see what offices accept medicare 4)  CT shows emphysema which has likely occured due to smoking over time.  Keep up your efforts to quit smoking. 5)  You have been given a pneumonia vaccine today 6)  Follow up in this office in 3 months for high blood pressure Prescriptions: TRAMADOL HCL 50 MG TABS (TRAMADOL HCL) One tablet by mouth two times a day as needed for pain  #50 x 0   Entered and Authorized by:   Lehman Prom FNP   Signed by:   Lehman Prom FNP on 12/09/2010   Method used:   Print then Give to Patient   RxID:   [id number] LISINOPRIL 40 MG TABS (LISINOPRIL) One tablet by mouth daily for blood pressure **note increase**  #30 x 11   Entered  and Authorized by:   Lehman Prom FNP   Signed by:   Lehman Prom FNP on 12/09/2010   Method used:   Electronically to        [facility] [address] [phone]* (retail)       [address] [place], [place]  [address]       Ph: [phone]       Fax: [phone]   RxID:   [id number] MACROBID 100 MG CAPS (NITROFURANTOIN MONOHYD MACRO) One capsule by mouth two times a day for infection  #14 x 0  Entered and Authorized by:   Lehman Prom FNP   Signed by:   Lehman Prom FNP on 12/09/2010   Method used:   Electronically to        [facility] [address] [phone]* (retail)       [address] [place], [place]  [address]       Ph: [phone]       Fax: [phone]   RxID:   [id number]    Orders Added: 1)  Capillary Blood Glucose/CBG [82948] 2)  Est. Patient Level III [44010] 3)  Pneumococcal Vaccine [90732] 4)  Admin 1st Vaccine [[address]]   Immunizations Administered:  Pneumonia Vaccine:    Vaccine Type: Pneumovax    Site: left deltoid    Mfr: Merck    Dose: 0.5 ml    Route: IM    Given by: Hale Drone CMA    Exp. Date: 01/28/2012    Lot #: [id number]    VIS given: 08/23/09 version given December 09, 2010.   Immunizations Administered:  Pneumonia Vaccine:    Vaccine Type: Pneumovax    Site: left deltoid    Mfr: Merck    Dose: 0.5 ml    Route: IM    Given by: Hale Drone CMA    Exp. Date: 01/28/2012    Lot #: [id number]    VIS given: 08/23/09 version given December 09, 2010.  Prevention & Chronic Care Immunizations   Influenza vaccine: Not documented   Influenza vaccine deferral: Refused  (11/16/2010)    Tetanus booster: Not documented    Pneumococcal vaccine: Pneumovax  (12/09/2010)    H. zoster vaccine: Not documented  Colorectal Screening   Hemoccult: Not documented    Colonoscopy: Not documented  Other Screening   PSA: 3.25  (11/16/2010)   Smoking status: current  (11/16/2010)  Lipids   Total Cholesterol: 151   (05/16/2010)   LDL: 90  (05/16/2010)   LDL Direct: Not documented   HDL: 38  (05/16/2010)   Triglycerides: 114  (05/16/2010)    SGOT (AST): 24  (11/16/2010)   SGPT (ALT): 15  (11/16/2010)   Alkaline phosphatase: 71  (11/16/2010)   Total bilirubin: 0.4  (11/16/2010)  Hypertension   Last Blood Pressure: 147 / 77  (12/09/2010)   Serum creatinine: 0.79  (11/16/2010)   Serum potassium 4.2  (11/16/2010)  Self-Management Support :    Hypertension self-management support: Not documented    Lipid self-management support: Not documented    Nursing Instructions: Give Pneumovax today

## 2010-12-15 NOTE — Progress Notes (Signed)
Summary: CT results  Phone Note Outgoing Call   Summary of Call: Notify pt that Ct of chest shows No findings of pulmonary asbestosis.  Mild emphysema which may have come from years of  smoking  Initial call taken by: Lehman Prom FNP,  December 01, 2010 6:06 PM  Follow-up for Phone Call        called [phone] and was given another number to reach pt. called [phone] mailbox is not setup. Levon Hedger  December 06, 2010 3:33 PM   Additional Follow-up for Phone Call Additional follow up Details #1::        pt into the office so CT results given to him today Additional Follow-up by: Lehman Prom FNP,  December 09, 2010 10:14 AM

## 2010-12-20 NOTE — Progress Notes (Signed)
Summary: Lisinopril changed  Phone Note Outgoing Call   Summary of Call: Received request from [facility] [address] for lisinopril 20 mg.  Pt. here 12/09/10 and new Rx for 40 mg. sent to [facility] [address].  Left message on answering machine for pt. to return call.    Initial call taken by: Dutch Quint RN,  December 15, 2010 4:03 PM  Follow-up for Phone Call        Pt. notified of new Rx at [facility] pharmacy notified to take lisinopril 20 mg. from pt.'s med list.  Dutch Quint RN  December 16, 2010 9:13 AM

## 2011-01-31 NOTE — Assessment & Plan Note (Signed)
OFFICE VISIT   Jeremiah Garcia, Jeremiah Garcia  DOB:  1946/01/02                                       09/28/2010  CHART#:[id number]   I saw the patient in the office for a 13-month followup after his  previous right carotid endarterectomy.  This is a 65 year old gentleman  who presented with sudden onset of left upper extremity weakness and  duplex scan which showed a greater than 80% right carotid stenosis.  He  underwent right carotid endarterectomy with Dacron patch angioplasty on  March 02, 2010.  He comes in for a 65-month followup visit.  Since I saw  him last, he has had no history of stroke, TIAs, expressive or receptive  aphasia, or amaurosis fugax.   SOCIAL HISTORY:  He is single.  He has 4 children.  He smokes a half-  pack per day of cigarettes and has been smoking for over a decade.   REVIEW OF SYSTEMS:  CARDIOVASCULAR:  He had no chest pain, chest  pressure, palpitations, or arrhythmias.  PULMONARY:  He has had no productive cough, bronchitis, asthma, or  wheezing.   PHYSICAL EXAMINATION:  This is a pleasant 65 year old gentleman who  appears his stated age.  Blood pressure is 149/83, heart rate is 73,  respiratory rate is 14.  Lungs:  Clear bilaterally to auscultation  without rales, rhonchi, or wheezing.  Cardiovascular exam:  I do not  detect any carotid bruits.  He has a regular rate and rhythm.  He has  palpable femoral pulses and warm well-perfused feet.  He has no  significant lower extremity swelling.  Abdomen:  Soft and nontender with  normal pitched bowel sounds.  Neurologic exam:  He has no focal weakness  or paresthesias.   I did independently interpret his carotid duplex scan which shows that  his right carotid endarterectomy site is widely patent.  There is no  evidence of restenosis.  On the left side, he has no significant  stenosis.  Both vertebral arteries are patent with normally directed  flow.   I have ordered a followup duplex scan  in 1 year.  We will follow his  duplex on a yearly basis.  I will plan on seeing him back only if there  is any significant change in his duplex or he develops any new  neurologic symptoms.  He does know to continue taking his aspirin and we  have also discussed the importance of tobacco cessation.     Di Kindle. Jeremiah Garcia, M.D.  Electronically Signed   CSD/MEDQ  D:  09/28/2010  T:  09/29/2010  Job:  [id number]

## 2011-01-31 NOTE — Assessment & Plan Note (Signed)
OFFICE VISIT   Jeremiah Garcia, Jeremiah Garcia  DOB:  [birth year]                                       03/31/2010  CHART#:[id number]   I saw the patient in the office today for followup after his recent  right carotid endarterectomy.  This is a 65 year old gentleman who had  presented with the sudden onset of left upper extremity weakness.  He  had a greater than 80% right carotid stenosis.  He underwent right  carotid endarterectomy with Dacron patch angioplasty on 03/02/2010.  He  returns for first outpatient visit.  He has had some persistent weakness  in the left hand which is gradually improving since his stroke.  He has  had no new neurologic symptoms.   On examination blood pressure is 167/93, heart rate is 74.  His incision  is healing nicely.  On neurologic exam he has some mild left upper  extremity weakness.   On his preoperative duplex he had no significant left carotid stenosis.   Overall I am pleased with his progress and I will see him back in 6  months with a followup duplex scan.  He knows to call sooner if he has  problems.  In the meantime he knows to continue taking his aspirin.     Di Kindle. Edilia Bo, M.D.  Electronically Signed   CSD/MEDQ  D:  03/31/2010  T:  04/01/2010  Job:  [id number]
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
## 2011-02-03 NOTE — Op Note (Signed)
[place]  Patient:    JAMEIRE, KOUBA                        MRN: [id number] Proc. Date: 09/05/00 Adm. Date:  [id number] Attending:  Andre Lefort                           Operative Report  DATE OF BIRTH:  07-26-1946. CCS [phone].  PREOPERATIVE DIAGNOSIS:  Right inguinal hernia.  POSTOPERATIVE DIAGNOSIS:  Medium-sized indirect right inguinal hernia.  PROCEDURE:  Right inguinal herniorrhaphy with mesh, open repair.  SURGEON:  Kristine Garbe. Ezzard Standing, M.D.  ANESTHESIA:  MAC anesthesia with approximately 35 cc of local anesthetic.  COMPLICATIONS:  None.  INDICATION FOR PROCEDURE:  Mr. Wirick is a 65 year old black male who has a symptomatic right inguinal hernia, who now comes for repair of this hernia.  DESCRIPTION OF PROCEDURE:  The patient was placed in the supine position and given a MAC anesthesia.  He had his right groin shaved, prepped with Betadine solution, and sterilely draped.  Using a mixture of 1% Xylocaine with epinephrine with 0.25% Marcaine with epinephrine, was infiltrated into the skin and down in all layers.  I opened the skin down to the external oblique fascia, which was opened.  The cord structures were encircled with a Penrose drain.  The inguinal floor appeared to be intact but on the anterior medial aspect of the cord, there was an indirect inguinal hernia.  The hernia was probably about 6 _____ in length, had a neck about 2 cm.  The sac was teased off the structures, was twisted, ligated with a 0 chromic suture.  The hernia was sent off for pathology, and the stump of the peritoneal stump retracted.  Then to the inguinal floor, a piece of 3 x 6 Atrium mesh which was cut to about 2-1/2 x 4-1/2 inches.  The mesh was sewn medially to the pubic tubercle, inferiorly to the shelving edge of the inguinal ligament, superiorly to transversalis fascia.  A keyhole was cut for the cord structures, and then the mesh  was sewn to itself behind the keyhole.  This left about a 1.5 cm internal ring opening when the cord structures had gone through.  The wound was then irrigated, the external oblique fascia was replaced over the cord and closed with 3-0 Vicryl suture.  The subcutaneous tissues were closed with 3-0 Vicryl sutures, the skin closed with a 5-0 Vicryl suture painted with benzoin and Steri-Strips and sterilely dressed.  The patient tolerated the procedure well, was transported to recovery room in good condition.  Sponge and needle count were correct. DD:  09/05/00 TD:  09/06/00 Job: [id number]

## 2011-05-03 ENCOUNTER — Emergency Department (HOSPITAL_COMMUNITY)
Admission: EM | Admit: 2011-05-03 | Discharge: 2011-05-03 | Disposition: A | Discharge status: Home / Self Care | Payer: Medicare Other | Attending: Emergency Medicine | Admitting: Emergency Medicine

## 2011-05-03 ENCOUNTER — Emergency Department (HOSPITAL_COMMUNITY): Payer: Medicare Other

## 2011-05-03 DIAGNOSIS — S99929A Unspecified injury of unspecified foot, initial encounter: Secondary | ICD-10-CM | POA: Insufficient documentation

## 2011-05-03 DIAGNOSIS — W28XXXA Contact with powered lawn mower, initial encounter: Secondary | ICD-10-CM | POA: Insufficient documentation

## 2011-05-03 DIAGNOSIS — Z79899 Other long term (current) drug therapy: Secondary | ICD-10-CM | POA: Insufficient documentation

## 2011-05-03 DIAGNOSIS — I10 Essential (primary) hypertension: Secondary | ICD-10-CM | POA: Insufficient documentation

## 2011-05-03 DIAGNOSIS — S8990XA Unspecified injury of unspecified lower leg, initial encounter: Secondary | ICD-10-CM | POA: Insufficient documentation

## 2011-05-03 DIAGNOSIS — Z7982 Long term (current) use of aspirin: Secondary | ICD-10-CM | POA: Insufficient documentation

## 2011-05-03 DIAGNOSIS — Y92009 Unspecified place in unspecified non-institutional (private) residence as the place of occurrence of the external cause: Secondary | ICD-10-CM | POA: Insufficient documentation

## 2011-05-03 DIAGNOSIS — M79609 Pain in unspecified limb: Secondary | ICD-10-CM | POA: Insufficient documentation

## 2011-05-03 DIAGNOSIS — S92919B Unspecified fracture of unspecified toe(s), initial encounter for open fracture: Secondary | ICD-10-CM | POA: Insufficient documentation

## 2011-05-03 LAB — BASIC METABOLIC PANEL
BUN: 9 mg/dL (ref 6–23)
Calcium: 9.5 mg/dL (ref 8.4–10.5)
Creatinine, Ser: 0.84 mg/dL (ref 0.50–1.35)
GFR calc Af Amer: >60 mL/min (ref >60)
GFR calc non Af Amer: >60 mL/min (ref >60)

## 2011-05-03 LAB — CBC
HCT: 46.1 % (ref 39.0–52.0)
MCHC: 34.1 g/dL (ref 30.0–36.0)
MCV: 84.7 fL (ref 78.0–100.0)
RDW: 14.6 % (ref 11.5–15.5)

## 2011-05-05 NOTE — H&P (Signed)
NAMEATTICUS, WEDIN NO.:  [id number]  MEDICAL RECORD NO.:  [id number]  LOCATION:                                 FACILITY:  PHYSICIAN:  Eulas Post, MD    DATE OF BIRTH:  July 09, 1946  DATE OF ADMISSION: DATE OF DISCHARGE:                             HISTORY & PHYSICAL   CHIEF COMPLAINT:  Left great toe injury.  HISTORY:  Mr. Jeremiah Garcia is a 65 year old gentleman who was mowing the lawn today and ran over his left great toe.  He had acute onset pain and bleeding with deformity.  This tore off his nail.  He came in the emergency room for evaluation and orthopedic consultation was requested. He reports severe pain directly over the left great toe and has had preexisting nail problems, but no other injuries like this in the past. He denies any other injuries.  PAST MEDICAL HISTORY:  He denies any medical problems.  FAMILY HISTORY:  His mother and father had coronary artery disease.  SOCIAL HISTORY:  He is a smoker, and tobacco intervention session was performed, and I counseled him on reducing smoking which decreases risks and optimizes healing for his toe.  REVIEW OF SYSTEMS:  Complete review of systems was performed and was otherwise negative with the exception of those mentioned above.  PHYSICAL EXAM:  CONSTITUTIONAL:  He is alert and oriented x3, in no acute distress. HEENT:  Eyes:  Extraocular movements are intact, and he is normocephalic and atraumatic. LYMPHATIC EXAM:  He has no axillary or cervical lymphadenopathy. CARDIOVASCULAR EXAM:  He has intact pedal pulses bilaterally with no pedal edema. RESPIRATORY:  He has no cyanosis and no labored breathing. ABDOMINAL:  His abdomen is soft, nontender with no rebound or guarding and no organomegaly. PSYCHIATRIC:  He is appropriate with me throughout the interaction, competent for consent. SKIN:  He has a complex laceration over the left great toe.  All other skin around the foot is  normal. MUSCULOSKELETAL:  His left great toe has avulsion of the nail bed, with a complex laceration of the nail bed.  There is gross contamination from the blades of the lawn mower saw, with grass and dirt as well.  X-rays demonstrate complex distal phalanx fracture of the great toe.  IMPRESSION:  Open left great toe tuft fracture with nail bed laceration.  PLAN:  This is an acute severe injury, and carries risks of infection, as well as nail abnormality growth, as well as sensitivity of the great toe, nonunion, malunion, among others.  I have recommended urgent irrigation and debridement with nail bed repair and then splinting in order to optimize the position of the tuft fracture.  He understands. We will need to remove his nail as it is already 95% taken off.  He would like to proceed with this, and we will plan to do so in the emergency room.  PROCEDURE DIAGNOSIS:  Open left great toe nail bed laceration and tuft fracture of the distal phalanx.  POSTOPERATIVE DIAGNOSIS:  Open left great toe nail bed laceration and tuft fracture of the distal phalanx.  OPERATIVE PROCEDURES:  Incision and drainage left great toe with nail bed  repair and closed management of tuft fracture.  ATTENDING SURGEON:  Eulas Post, MD.  FIRST ASSISTANT:  Kingsley Plan, orthopedic, PA-C.  PREPROCEDURE INDICATIONS:  Please see above.  PROCEDURE DETAILS:  After informed verbal consent was obtained, the left foot was prepped with Betadine and a digital block was performed, and then the wound was debrided and copiously irrigated with saline.  Once a clean bed of tissue was achieved, the nail bed was repaired with chromic.  Sterile gauze and nonstick dressing was applied followed by a hard sole shoe.  He tolerated this well.  There were no complications. Plan will be to place on p.o. antibiotics, and he is already received IV antibiotics, and will follow-up with me in 1 week for an wound  check.     Eulas Post, MD     JPL/MEDQ  D:  05/04/2011  T:  05/04/2011  Job:  [id number]  Electronically Signed by Teryl Lucy MD on 05/05/2011 02:39:39 PM

## 2011-06-20 LAB — URINALYSIS, ROUTINE W REFLEX MICROSCOPIC
Glucose, UA: NEGATIVE
Ketones, ur: 15 — AB
Protein, ur: NEGATIVE

## 2011-06-20 LAB — POCT I-STAT, CHEM 8
Calcium, Ion: 1.11 — ABNORMAL LOW
HCT: 51
TCO2: 28

## 2011-11-13 ENCOUNTER — Other Ambulatory Visit: Payer: Medicare Other

## 2011-11-21 ENCOUNTER — Encounter: Payer: Self-pay | Admitting: Vascular Surgery

## 2011-11-22 ENCOUNTER — Other Ambulatory Visit (INDEPENDENT_AMBULATORY_CARE_PROVIDER_SITE_OTHER): Payer: Medicare Other | Admitting: *Deleted

## 2011-11-22 DIAGNOSIS — I6529 Occlusion and stenosis of unspecified carotid artery: Secondary | ICD-10-CM

## 2011-11-22 DIAGNOSIS — Z48812 Encounter for surgical aftercare following surgery on the circulatory system: Secondary | ICD-10-CM

## 2011-12-06 ENCOUNTER — Other Ambulatory Visit: Payer: Self-pay | Admitting: *Deleted

## 2011-12-06 DIAGNOSIS — I6529 Occlusion and stenosis of unspecified carotid artery: Secondary | ICD-10-CM

## 2011-12-06 DIAGNOSIS — Z48812 Encounter for surgical aftercare following surgery on the circulatory system: Secondary | ICD-10-CM

## 2011-12-11 NOTE — Procedures (Unsigned)
CAROTID DUPLEX EXAM  INDICATION:  Carotid endarterectomy  HISTORY: Diabetes:  No Cardiac:  No Hypertension:  Yes Smoking:  Yes Previous Surgery:  Right carotid endarterectomy on 03/02/2010 CV History:  Complaint of dizziness when lying down Amaurosis Fugax No, Paresthesias No, Hemiparesis No                                      RIGHT             LEFT Brachial systolic pressure:         156               158 Brachial Doppler waveforms:         Normal            Normal Vertebral direction of flow:        Antegrade         Antegrade DUPLEX VELOCITIES (cm/sec) CCA peak systolic                   83                88 ECA peak systolic                   96                96 ICA peak systolic                   71                61 ICA end diastolic                   21                20 PLAQUE MORPHOLOGY:                                    Heterogeneous PLAQUE AMOUNT:                      None              Mild PLAQUE LOCATION:                                      ICA  IMPRESSION: 1. Patent carotid endarterectomy site with no hemodynamically     significant stenoses of the bilateral internal carotid arteries. 2. No significant change noted when compared to the previous exam on     09/28/2010.  ___________________________________________ Quita Skye. Hart Rochester, M.D.  CH/MEDQ  D:  11/24/2011  T:  11/24/2011  Job:  [id number]

## 2011-12-12 ENCOUNTER — Encounter: Payer: Self-pay | Admitting: Vascular Surgery

## 2012-09-16 ENCOUNTER — Encounter: Payer: Self-pay | Admitting: Vascular Surgery

## 2012-10-12 ENCOUNTER — Emergency Department (HOSPITAL_COMMUNITY): Payer: PRIVATE HEALTH INSURANCE

## 2012-10-12 ENCOUNTER — Emergency Department (HOSPITAL_COMMUNITY)
Admission: EM | Admit: 2012-10-12 | Discharge: 2012-10-12 | Disposition: A | Discharge status: Home / Self Care | Payer: PRIVATE HEALTH INSURANCE | Attending: Emergency Medicine | Admitting: Emergency Medicine

## 2012-10-12 ENCOUNTER — Encounter (HOSPITAL_COMMUNITY): Payer: Self-pay

## 2012-10-12 ENCOUNTER — Other Ambulatory Visit (HOSPITAL_COMMUNITY): Payer: Medicare Other

## 2012-10-12 DIAGNOSIS — Z8673 Personal history of transient ischemic attack (TIA), and cerebral infarction without residual deficits: Secondary | ICD-10-CM | POA: Insufficient documentation

## 2012-10-12 DIAGNOSIS — W1809XA Striking against other object with subsequent fall, initial encounter: Secondary | ICD-10-CM | POA: Insufficient documentation

## 2012-10-12 DIAGNOSIS — R55 Syncope and collapse: Secondary | ICD-10-CM | POA: Insufficient documentation

## 2012-10-12 DIAGNOSIS — Z79899 Other long term (current) drug therapy: Secondary | ICD-10-CM | POA: Insufficient documentation

## 2012-10-12 DIAGNOSIS — R61 Generalized hyperhidrosis: Secondary | ICD-10-CM | POA: Insufficient documentation

## 2012-10-12 DIAGNOSIS — R059 Cough, unspecified: Secondary | ICD-10-CM | POA: Insufficient documentation

## 2012-10-12 DIAGNOSIS — I10 Essential (primary) hypertension: Secondary | ICD-10-CM | POA: Insufficient documentation

## 2012-10-12 DIAGNOSIS — F17200 Nicotine dependence, unspecified, uncomplicated: Secondary | ICD-10-CM | POA: Insufficient documentation

## 2012-10-12 DIAGNOSIS — Y9301 Activity, walking, marching and hiking: Secondary | ICD-10-CM | POA: Insufficient documentation

## 2012-10-12 DIAGNOSIS — S01111A Laceration without foreign body of right eyelid and periocular area, initial encounter: Secondary | ICD-10-CM

## 2012-10-12 DIAGNOSIS — Y92009 Unspecified place in unspecified non-institutional (private) residence as the place of occurrence of the external cause: Secondary | ICD-10-CM | POA: Insufficient documentation

## 2012-10-12 DIAGNOSIS — J3489 Other specified disorders of nose and nasal sinuses: Secondary | ICD-10-CM | POA: Insufficient documentation

## 2012-10-12 DIAGNOSIS — I6529 Occlusion and stenosis of unspecified carotid artery: Secondary | ICD-10-CM | POA: Insufficient documentation

## 2012-10-12 DIAGNOSIS — S058X9A Other injuries of unspecified eye and orbit, initial encounter: Secondary | ICD-10-CM | POA: Insufficient documentation

## 2012-10-12 DIAGNOSIS — R05 Cough: Secondary | ICD-10-CM

## 2012-10-12 DIAGNOSIS — E785 Hyperlipidemia, unspecified: Secondary | ICD-10-CM | POA: Insufficient documentation

## 2012-10-12 DIAGNOSIS — Z7982 Long term (current) use of aspirin: Secondary | ICD-10-CM | POA: Insufficient documentation

## 2012-10-12 LAB — CBC
HCT: 48.2 % (ref 39.0–52.0)
MCH: 29.9 pg (ref 26.0–34.0)
MCV: 86.8 fL (ref 78.0–100.0)
RBC: 5.55 MIL/uL (ref 4.22–5.81)
WBC: 9 10*3/uL (ref 4.0–10.5)

## 2012-10-12 LAB — BASIC METABOLIC PANEL
BUN: 10 mg/dL (ref 6–23)
CO2: 22 mEq/L (ref 19–32)
Calcium: 9.2 mg/dL (ref 8.4–10.5)
Chloride: 101 mEq/L (ref 96–112)
Creatinine, Ser: 0.79 mg/dL (ref 0.50–1.35)

## 2012-10-12 LAB — ETHANOL: Alcohol, Ethyl (B): <11 mg/dL (ref 0–11)

## 2012-10-12 MED ORDER — LIDOCAINE-EPINEPHRINE 2 %-1:100000 IJ SOLN
20.0000 mL | Freq: Once | INTRAMUSCULAR | Status: DC
  Filled 2012-10-12: qty 20

## 2012-10-12 MED ORDER — LIDOCAINE-EPINEPHRINE 1 %-1:100000 IJ SOLN: 20.0000 mL | Freq: Once | INTRAMUSCULAR | Status: DC

## 2012-10-12 NOTE — ED Notes (Signed)
Pt ambulated to restroom and around Pod A and B.  Pt ambulated without assistance and without any complications.  Notified Dr. Ranae Palms.

## 2012-10-12 NOTE — ED Notes (Signed)
Patient transported to CT 

## 2012-10-12 NOTE — ED Notes (Addendum)
EMS reports patient with fainting spell, house was very warm, was in a standing position, had near syncopal episode, was diaphoretic on arrival, now feeling normal self on arrival to the ED, 1 inch lac to the right outer eye brow

## 2012-10-12 NOTE — ED Provider Notes (Signed)
Discussed results with pt. Pt states symptoms are completely resolved. I have advised admission for observation and further workup but pt states he would like to go home. Will ambulate in halls and reassess.   Pt ambulating without assistance or symptoms. Pt is aware that he needs to follow up with a primary MD, should return immediately for return of symptoms and needs to have his sutures removed in 5-7 days. He is competent to make medical decisions regarding his care and will be d/c according to his wishes.   Loren Racer, MD 10/12/12 [phone]

## 2012-10-12 NOTE — ED Provider Notes (Signed)
History     CSN: [id number]  Arrival date & time 10/12/12  1330   First MD Initiated Contact with Patient 10/12/12 1345      Chief Complaint  Patient presents with  . Near Syncope    (Consider location/radiation/quality/duration/timing/severity/associated sxs/prior treatment) HPI Comments: Mr. Dewalt presents from home.  "I just think I got over heated."  He was sitting at home.  Family noted him to become acutely diaphoretic with a glazed look in his eyes.  He rose and started walking across the room.  He fell forward striking his head.  He was never completely unconscious.  He denies having chest pain and shortness of breath.  He reports having a cough for the last 22 weeks but feels it is improving.  The history is provided by the patient. No language interpreter was used.    Past Medical History  Diagnosis Date  . Hyperlipidemia   . Hypertension   . CVA (cerebral infarction) 2011    with Left upper extremity weakness  . Carotid artery occlusion March 02, 2010    RIGHT  cea  . Stroke June 2011    Left side weakness    Past Surgical History  Procedure Date  . Carotid endarterectomy March 02, 2010    RIGHT cea    History reviewed. No pertinent family history.  History  Substance Use Topics  . Smoking status: Current Every Day Smoker -- 0.5 packs/day    Types: Cigarettes  . Smokeless tobacco: Never Used  . Alcohol Use: No      Review of Systems  Constitutional: Positive for diaphoresis. Negative for fever, chills and fatigue.  HENT: Positive for congestion. Negative for nosebleeds, sore throat, rhinorrhea and neck pain.   Respiratory: Negative for cough, chest tightness and shortness of breath.   Cardiovascular: Negative for chest pain, palpitations and leg swelling.  Gastrointestinal: Negative for vomiting, abdominal pain and diarrhea.  Skin: Negative.   Neurological: Positive for syncope. Negative for tremors, weakness, light-headedness and headaches.     Allergies  Review of patient's allergies indicates no known allergies.  Home Medications   Current Outpatient Rx  Name  Route  Sig  Dispense  Refill  . ASPIRIN EC 81 MG PO TBEC   Oral   Take 81 mg by mouth daily.         . OMEGA-3 FATTY ACIDS 1000 MG PO CAPS   Oral   Take 1 g by mouth 2 (two) times daily.          Marland Kitchen LISINOPRIL 10 MG PO TABS   Oral   Take 10 mg by mouth daily.         Marland Kitchen ROSUVASTATIN CALCIUM 20 MG PO TABS   Oral   Take 20 mg by mouth daily.           BP 139/79  Pulse 103  Temp 97.3 F (36.3 C) (Oral)  Resp 12  SpO2 98%  Physical Exam  Nursing note and vitals reviewed. Constitutional: He is oriented to person, place, and time. He appears well-developed and well-nourished. No distress.  HENT:  Head: Normocephalic. Head is with contusion and with laceration. Head is without raccoon's eyes, without Battle's sign, without abrasion, without right periorbital erythema and without left periorbital erythema. No trismus in the jaw.    Right Ear: Hearing, tympanic membrane, external ear and ear canal normal. No mastoid tenderness. No hemotympanum.  Left Ear: Hearing, tympanic membrane, external ear and ear canal normal. No mastoid tenderness.  No hemotympanum.  Nose: Nose normal. No rhinorrhea. No epistaxis. Left sinus exhibits no maxillary sinus tenderness and no frontal sinus tenderness.  Mouth/Throat: Oropharynx is clear and moist and mucous membranes are normal. Mucous membranes are not pale, not dry and not cyanotic. No uvula swelling. No oropharyngeal exudate.  Eyes: Conjunctivae normal and EOM are normal. Pupils are equal, round, and reactive to light. Right eye exhibits no discharge. Left eye exhibits no discharge. No scleral icterus.  Neck: Normal range of motion. Neck supple. No JVD present. No tracheal deviation present.  Cardiovascular: Normal rate, regular rhythm, normal heart sounds and intact distal pulses.  Exam reveals no gallop and no  friction rub.   No murmur heard. Pulmonary/Chest: Effort normal and breath sounds normal. No stridor. No respiratory distress. He has no wheezes. He has no rales. He exhibits no tenderness.  Abdominal: Soft. Bowel sounds are normal. He exhibits no distension and no mass. There is no tenderness. There is no rebound and no guarding.  Musculoskeletal: Normal range of motion. He exhibits no edema and no tenderness.  Lymphadenopathy:    He has no cervical adenopathy.  Neurological: He is alert and oriented to person, place, and time. No cranial nerve deficit. He exhibits normal muscle tone. Coordination normal.  Skin: Skin is warm and dry. No rash noted. He is not diaphoretic. No erythema. No pallor.  Psychiatric: He has a normal mood and affect. His behavior is normal.    ED Course  LACERATION REPAIR Date/Time: 10/12/2012 3:32 PM Performed by: Dana Allan T Authorized by: Dana Allan T Consent: Verbal consent obtained. Written consent not obtained. Risks and benefits: risks, benefits and alternatives were discussed Consent given by: patient Patient understanding: patient states understanding of the procedure being performed Required items: required blood products, implants, devices, and special equipment available Time out: Immediately prior to procedure a "time out" was called to verify the correct patient, procedure, equipment, support staff and site/side marked as required. Body area: head/neck Location details: right eyelid Laceration length: 3.5 cm Tendon involvement: none Nerve involvement: none Vascular damage: no Anesthesia: local infiltration Local anesthetic: lidocaine 1% with epinephrine Anesthetic total: 2.5 ml Patient sedated: no Preparation: Patient was prepped and draped in the usual sterile fashion. Irrigation solution: saline Irrigation method: syringe Amount of cleaning: standard Debridement: none Degree of undermining: none Skin closure: 5-0 Prolene Number  of sutures: 5 Technique: simple Approximation: close Approximation difficulty: simple Dressing: antibiotic ointment Patient tolerance: Patient tolerated the procedure well with no immediate complications.   (including critical care time)   Labs Reviewed  CBC  BASIC METABOLIC PANEL  PROTIME-INR  TROPONIN I   No results found.   No diagnosis found.   Date: 10/12/2012 @ 1345  Rate: 90 bpm  Rhythm: sinus  QRS Axis: normal  Intervals: normal  ST/T Wave abnormalities: normal  Conduction Disutrbances:none  Narrative Interpretation: sinus rhythm with small inf and lateral q waves  Old EKG Reviewed: none available      MDM  Pt presents for evaluation after a syncopal event at home.  He reports feeling fine now, note a laceration above the right eye, mildly elevated HR, NAD.  He has no prior hx of syncope and states he is getting over a cold.  Note no focal neurologic deficits.  Will repair the laceration primarily.  Will obtain a CT scan of the head (he has no midline neck tenderness, mental status changes, or distracting injuries), basic labs, CXR, EKF, and trop.  Will reassess.  Anticipate admission.  1615.  Pt stable, NAD.  Laceration closed.  Awaiting lab results.  CT of head demonstrates chronic changes but no acute pathology.  CXR demonstrates chronic emphysematous changes.  Pt signed over to Dr. Ranae Palms pending lab results.  Anticipate admission secondary to syncope.       Tobin Chad, MD 10/12/12 [phone]

## 2012-10-19 ENCOUNTER — Emergency Department (HOSPITAL_COMMUNITY)
Admission: EM | Admit: 2012-10-19 | Discharge: 2012-10-19 | Disposition: A | Discharge status: Home / Self Care | Payer: PRIVATE HEALTH INSURANCE | Attending: Emergency Medicine | Admitting: Emergency Medicine

## 2012-10-19 ENCOUNTER — Encounter (HOSPITAL_COMMUNITY): Payer: Self-pay | Admitting: Emergency Medicine

## 2012-10-19 DIAGNOSIS — IMO0002 Reserved for concepts with insufficient information to code with codable children: Secondary | ICD-10-CM

## 2012-10-19 DIAGNOSIS — Z7982 Long term (current) use of aspirin: Secondary | ICD-10-CM | POA: Insufficient documentation

## 2012-10-19 DIAGNOSIS — Z79899 Other long term (current) drug therapy: Secondary | ICD-10-CM | POA: Insufficient documentation

## 2012-10-19 DIAGNOSIS — E785 Hyperlipidemia, unspecified: Secondary | ICD-10-CM | POA: Insufficient documentation

## 2012-10-19 DIAGNOSIS — F17200 Nicotine dependence, unspecified, uncomplicated: Secondary | ICD-10-CM | POA: Insufficient documentation

## 2012-10-19 DIAGNOSIS — R5381 Other malaise: Secondary | ICD-10-CM | POA: Insufficient documentation

## 2012-10-19 DIAGNOSIS — I10 Essential (primary) hypertension: Secondary | ICD-10-CM | POA: Insufficient documentation

## 2012-10-19 DIAGNOSIS — I69998 Other sequelae following unspecified cerebrovascular disease: Secondary | ICD-10-CM | POA: Insufficient documentation

## 2012-10-19 DIAGNOSIS — Z4802 Encounter for removal of sutures: Secondary | ICD-10-CM | POA: Insufficient documentation

## 2012-10-19 DIAGNOSIS — Z8679 Personal history of other diseases of the circulatory system: Secondary | ICD-10-CM | POA: Insufficient documentation

## 2012-10-19 NOTE — ED Notes (Signed)
Sutures to right brow intact, healed well.

## 2012-10-19 NOTE — ED Provider Notes (Signed)
History     CSN: [id number]  Arrival date & time 10/19/12  1117   First MD Initiated Contact with Patient 10/19/12 1123      Chief Complaint  Patient presents with  . Wound Check    (Consider location/radiation/quality/duration/timing/severity/associated sxs/prior treatment) HPI  Jeremiah Garcia is a 67 y.o. male presenting for suture removal to right eyelid area. Sutures were placed approximately 10 days ago. Patient denies any issues including discharge, pain, fever, nausea or vomiting.  Past Medical History  Diagnosis Date  . Hyperlipidemia   . Hypertension   . CVA (cerebral infarction) 2011    with Left upper extremity weakness  . Carotid artery occlusion March 02, 2010    RIGHT  cea  . Stroke June 2011    Left side weakness    Past Surgical History  Procedure Date  . Carotid endarterectomy March 02, 2010    RIGHT cea    No family history on file.  History  Substance Use Topics  . Smoking status: Current Every Day Smoker -- 0.5 packs/day    Types: Cigarettes  . Smokeless tobacco: Never Used  . Alcohol Use: No      Review of Systems  Constitutional: Negative for fever.  Respiratory: Negative for shortness of breath.   Cardiovascular: Negative for chest pain.  Gastrointestinal: Negative for nausea, vomiting, abdominal pain and diarrhea.  Skin: Positive for wound.  All other systems reviewed and are negative.    Allergies  Review of patient's allergies indicates no known allergies.  Home Medications   Current Outpatient Rx  Name  Route  Sig  Dispense  Refill  . ASPIRIN EC 81 MG PO TBEC   Oral   Take 81 mg by mouth daily.         . OMEGA-3 FATTY ACIDS 1000 MG PO CAPS   Oral   Take 1 g by mouth 2 (two) times daily.          Marland Kitchen LISINOPRIL 10 MG PO TABS   Oral   Take 10 mg by mouth daily.         Marland Kitchen ROSUVASTATIN CALCIUM 20 MG PO TABS   Oral   Take 20 mg by mouth daily.           BP 157/68  Pulse 93  Temp 98.1 F (36.7 C) (Oral)   Resp 20  SpO2 96%  Physical Exam  Nursing note and vitals reviewed. Constitutional: He is oriented to person, place, and time. He appears well-developed and well-nourished. No distress.  HENT:  Head: Normocephalic.  Eyes: Conjunctivae normal and EOM are normal.  Cardiovascular: Normal rate.   Pulmonary/Chest: Effort normal. No stridor.  Musculoskeletal: Normal range of motion.  Neurological: He is alert and oriented to person, place, and time.  Skin:       Well-healing laceration to the inferior lateral right eyebrow. There are 5 polypropylene sutures in place. Wound is clean dry and intact. No discharge, warmth or other signs of infection.  Psychiatric: He has a normal mood and affect.    ED Course  Procedures (including critical care time)  Labs Reviewed - No data to display No results found.   1. Laceration       MDM  Visit for suture removal, no signs of infection.  Filed Vitals:   10/19/12 1119  BP: 157/68  Pulse: 93  Temp: 98.1 F (36.7 C)  TempSrc: Oral  Resp: 20  SpO2: 96%     Pt verbalized understanding  and agrees with care plan. Outpatient follow-up and return precautions given.            Wynetta Emery, PA-C 10/19/12 2102

## 2012-10-20 NOTE — ED Provider Notes (Signed)
Medical screening examination/treatment/procedure(s) were performed by non-physician practitioner and as supervising physician I was immediately available for consultation/collaboration.   Suzi Roots, MD 10/20/12 [phone]

## 2012-11-01 ENCOUNTER — Encounter (HOSPITAL_COMMUNITY): Payer: Self-pay

## 2012-11-01 ENCOUNTER — Emergency Department (INDEPENDENT_AMBULATORY_CARE_PROVIDER_SITE_OTHER)
Admission: EM | Admit: 2012-11-01 | Discharge: 2012-11-01 | Disposition: A | Discharge status: Home / Self Care | Payer: Medicare Other | Source: Home / Self Care

## 2012-11-01 DIAGNOSIS — F17200 Nicotine dependence, unspecified, uncomplicated: Secondary | ICD-10-CM

## 2012-11-01 DIAGNOSIS — I10 Essential (primary) hypertension: Secondary | ICD-10-CM

## 2012-11-01 DIAGNOSIS — E785 Hyperlipidemia, unspecified: Secondary | ICD-10-CM

## 2012-11-01 LAB — COMPREHENSIVE METABOLIC PANEL
ALT: 16 U/L (ref 0–53)
AST: 24 U/L (ref 0–37)
Alkaline Phosphatase: 84 U/L (ref 39–117)
CO2: 26 mEq/L (ref 19–32)
Chloride: 104 mEq/L (ref 96–112)
GFR calc non Af Amer: >90 mL/min (ref >90)
Glucose, Bld: 128 mg/dL — ABNORMAL HIGH (ref 70–99)
Potassium: 3.4 mEq/L — ABNORMAL LOW (ref 3.5–5.1)
Sodium: 139 mEq/L (ref 135–145)
Total Bilirubin: 0.3 mg/dL (ref 0.3–1.2)

## 2012-11-01 MED ORDER — AMLODIPINE BESYLATE 5 MG PO TABS: 5.0000 mg | ORAL_TABLET | Freq: Every day | ORAL | Status: DC

## 2012-11-01 MED ORDER — ROSUVASTATIN CALCIUM 40 MG PO TABS: 40.0000 mg | ORAL_TABLET | Freq: Every day | ORAL | Status: AC

## 2012-11-01 MED ORDER — LISINOPRIL 40 MG PO TABS: 40.0000 mg | ORAL_TABLET | Freq: Every day | ORAL | Status: DC

## 2012-11-01 MED ORDER — TADALAFIL 20 MG PO TABS: ORAL_TABLET | ORAL | Status: DC

## 2012-11-01 NOTE — ED Notes (Signed)
Patient has history of HTN- needs medication refill

## 2012-11-01 NOTE — ED Provider Notes (Addendum)
History     CSN: [id number]  Arrival date & time 11/01/12  1636   First MD Initiated Contact with Patient 11/01/12 1715      Chief Complaint  Patient presents with  . Hypertension    (Consider location/radiation/quality/duration/timing/severity/associated sxs/prior treatment) HPI Patient is here today for a regular physical. He is compliant with all his medications. He needs refills for all his medications. Patient complained of back pain on and off since last few months. He has tried over-the-counter pain medications which works well for him. There are no red flag signs like radiation to the legs, change in urinary habits, change in bowel movements, weakness or numbness in any part of his legs. No fevers or chills noted. The pain gets worse with heavy lifting and movement and better with rest.  Patient asked me whether he could use Cialis for his erection problems at this time.   Past Medical History  Diagnosis Date  . Hyperlipidemia   . Hypertension   . CVA (cerebral infarction) 2011    with Left upper extremity weakness  . Carotid artery occlusion March 02, 2010    RIGHT  cea  . Stroke June 2011    Left side weakness    Past Surgical History  Procedure Laterality Date  . Carotid endarterectomy  March 02, 2010    RIGHT cea    No family history on file.  History  Substance Use Topics  . Smoking status: Current Every Day Smoker -- 0.50 packs/day    Types: Cigarettes  . Smokeless tobacco: Never Used  . Alcohol Use: No      Review of Systems  Allergies  Review of patient's allergies indicates no known allergies.  Home Medications   Current Outpatient Rx  Name  Route  Sig  Dispense  Refill  . amLODipine (NORVASC) 5 MG tablet   Oral   Take 5 mg by mouth daily.         Marland Kitchen lisinopril (PRINIVIL,ZESTRIL) 40 MG tablet   Oral   Take 40 mg by mouth daily.         Marland Kitchen aspirin EC 81 MG tablet   Oral   Take 81 mg by mouth daily.         . fish oil-omega-3  fatty acids 1000 MG capsule   Oral   Take 1 g by mouth 2 (two) times daily.          Marland Kitchen lisinopril (PRINIVIL,ZESTRIL) 10 MG tablet   Oral   Take 10 mg by mouth daily.         . rosuvastatin (CRESTOR) 20 MG tablet   Oral   Take 40 mg by mouth daily.            BP 137/78  Pulse 90  Temp(Src) 98.3 F (36.8 C) (Oral)  SpO2 100%  Physical Exam Physical Exam: General: Vital signs reviewed and noted. Well-developed, well-nourished, in no acute distress; alert, appropriate and cooperative throughout examination.  Head: Normocephalic, atraumatic.  Eyes: PERRL, EOMI, No signs of anemia or jaundince.  Nose: Mucous membranes moist, not inflammed, nonerythematous.  Throat: Oropharynx nonerythematous, no exudate appreciated.   Neck: No deformities, masses, or tenderness noted.Supple, No carotid Bruits, no JVD.  Lungs:  Normal respiratory effort. Clear to auscultation BL without crackles or wheezes.  Heart: RRR. S1 and S2 normal without gallop, murmur, or rubs.  Abdomen:  BS normoactive. Soft, Nondistended, non-tender.  No masses or organomegaly.some tenderness in the lower back in the paraspinal muscles  of lumbar region noted   Extremities: No pretibial edema.straight leg raising test was negative   Neurologic: A&O X3, CN II - XII are grossly intact. Motor strength is 5/5 in the all 4 extremities, Sensations intact to light touch, Cerebellar signs negative.  Skin: No visible rashes, scars.     ED Course  Procedures (including critical care time)  Labs Reviewed - No data to display No results found.   No diagnosis found.    MDM  1. Patient's medications were refilled today. 2.Check CMP today. 3. Follow up in 6 months. 4. OTC advil/tylenol for back pain. Patient can also benefit from heat pad. 5. patient was prescribed 10 mg of Cialis to be used on an as-needed basis prior to sexual activity. He understands the risks associated with the use of Cialis at this time which  includes sudden dizziness, palpitations, heart attack and sudden death. 6. patient is up-to-date on his colonoscopy and does not want flu shot. Patient said that he had the pneumonia shot last year when he turned 61.        Lars Mage, MD 11/01/12 1727  Lars Mage, MD 11/01/12 1742

## 2012-11-20 ENCOUNTER — Encounter: Payer: Self-pay | Admitting: Neurosurgery

## 2012-11-21 ENCOUNTER — Ambulatory Visit: Payer: Medicare Other | Admitting: Neurosurgery

## 2012-11-21 ENCOUNTER — Other Ambulatory Visit: Payer: Medicare Other

## 2012-12-12 ENCOUNTER — Other Ambulatory Visit: Payer: Medicare Other

## 2012-12-12 ENCOUNTER — Ambulatory Visit: Payer: Medicare Other | Admitting: Neurosurgery

## 2013-01-09 ENCOUNTER — Other Ambulatory Visit: Payer: Self-pay | Admitting: *Deleted

## 2013-01-21 ENCOUNTER — Encounter: Payer: Self-pay | Admitting: Vascular Surgery

## 2013-01-22 ENCOUNTER — Encounter: Payer: Self-pay | Admitting: Vascular Surgery

## 2013-01-22 ENCOUNTER — Ambulatory Visit (INDEPENDENT_AMBULATORY_CARE_PROVIDER_SITE_OTHER): Payer: Medicare Other | Admitting: Vascular Surgery

## 2013-01-22 ENCOUNTER — Other Ambulatory Visit (INDEPENDENT_AMBULATORY_CARE_PROVIDER_SITE_OTHER): Payer: PRIVATE HEALTH INSURANCE | Admitting: *Deleted

## 2013-01-22 DIAGNOSIS — I6529 Occlusion and stenosis of unspecified carotid artery: Secondary | ICD-10-CM

## 2013-01-22 DIAGNOSIS — Z48812 Encounter for surgical aftercare following surgery on the circulatory system: Secondary | ICD-10-CM

## 2013-01-22 NOTE — Assessment & Plan Note (Signed)
This patient is status post right carotid endarterectomy in June of 2011. The right carotid endarterectomy site is widely patent with no significant stenosis on the left either. Both vertebral arteries are patent with normally directed flow. I recommended a follow up carotid duplex scan in 1 year. I'll see him back in 2 years unless there is any change in his duplex at one year. In the meantime he knows to continue taking his aspirin. His cholesterol and blood pressure followed by his primary care physician and he is on a statin. He also discussed the importance of tobacco cessation. It continued tobacco use does increase his risk of progressive vascular disease.

## 2013-01-22 NOTE — Addendum Note (Signed)
Addended by: Adria Dill L on: 01/22/2013 10:20 AM   Modules accepted: Orders

## 2013-01-22 NOTE — Progress Notes (Signed)
[facility]  Patient name: Jeremiah Garcia MRN: [id number] DOB: 07-05-1946 Sex: male  REASON FOR VISIT: Follow up status post right carotid endarterectomy.  HPI: Jeremiah Garcia is a 67 y.o. male underwent a right carotid endarterectomy in June of 2011 for asymptomatic right carotid stenosis. He comes in for a routine follow up visit. He denies any history of stroke, TIAs, expressive or receptive aphasia, or amaurosis fugax.  There has been no significant change in his medical history. He is on Crestor. His blood pressure has been well controlled. Does continue to smoke half a pack per day.  REVIEW OF SYSTEMS: Arly.Keller ] denotes positive finding; [  ] denotes negative finding  CARDIOVASCULAR:  [ ]  chest pain   [ ]  dyspnea on exertion    CONSTITUTIONAL:  [ ]  fever   [ ]  chills  PHYSICAL EXAM: Filed Vitals:   01/22/13 0912 01/22/13 0914  BP: 142/82 134/80  Pulse: 77   Height: 6' 0.25" (1.835 m)   Weight: 181 lb (82.101 kg)   SpO2: 100%    Body mass index is 24.38 kg/(m^2). GENERAL: The patient is a well-nourished male, in no acute distress. The vital signs are documented above. CARDIOVASCULAR: There is a regular rate and rhythm. I do not detect carotid bruits. PULMONARY: There is good air exchange bilaterally without wheezing or rales. Both feet are warm and well-perfused.  I have independently interpreted his carotid duplex scan which shows a widely patent right carotid endarterectomy site without evidence of restenosis. There is no significant left carotid stenosis. Both vertebral arteries are patent with normally directed flow.  MEDICAL ISSUES:  CAROTID ARTERY STENOSIS, RIGHT This patient is status post right carotid endarterectomy in June of 2011. The right carotid endarterectomy site is widely patent with no significant stenosis on the left either. Both vertebral arteries are patent with normally directed flow. I recommended a follow up carotid duplex scan  in 1 year. I'll see him back in 2 years unless there is any change in his duplex at one year. In the meantime he knows to continue taking his aspirin. His cholesterol and blood pressure followed by his primary care physician and he is on a statin. He also discussed the importance of tobacco cessation. It continued tobacco use does increase his risk of progressive vascular disease.   DICKSON,CHRISTOPHER S [facility] [place] [facility]: [phone]

## 2013-10-23 ENCOUNTER — Other Ambulatory Visit: Payer: Self-pay | Admitting: Vascular Surgery

## 2013-10-23 DIAGNOSIS — I6529 Occlusion and stenosis of unspecified carotid artery: Secondary | ICD-10-CM

## 2013-10-23 DIAGNOSIS — Z48812 Encounter for surgical aftercare following surgery on the circulatory system: Secondary | ICD-10-CM

## 2014-01-12 ENCOUNTER — Telehealth: Payer: Self-pay | Admitting: *Deleted

## 2014-01-12 NOTE — Telephone Encounter (Signed)
Jeremiah Garcia from [facility] called to request NPI number for patient. NPI given, pt needs to contact medicaid office to change provider information on card. Jeremiah Puamika L Darvell Monteforte, RN

## 2014-01-20 ENCOUNTER — Encounter: Payer: Self-pay | Admitting: Family

## 2014-01-21 ENCOUNTER — Ambulatory Visit (HOSPITAL_COMMUNITY)
Admission: RE | Admit: 2014-01-21 | Discharge: 2014-01-21 | Disposition: A | Discharge status: Home / Self Care | Payer: PRIVATE HEALTH INSURANCE | Source: Ambulatory Visit | Attending: Vascular Surgery | Admitting: Vascular Surgery

## 2014-01-21 ENCOUNTER — Encounter: Payer: Self-pay | Admitting: Family

## 2014-01-21 ENCOUNTER — Ambulatory Visit (INDEPENDENT_AMBULATORY_CARE_PROVIDER_SITE_OTHER): Payer: PRIVATE HEALTH INSURANCE | Admitting: Family

## 2014-01-21 VITALS — BP 129/80 | HR 72 | Resp 16 | Ht 73.0 in | Wt 181.0 lb

## 2014-01-21 DIAGNOSIS — I6529 Occlusion and stenosis of unspecified carotid artery: Secondary | ICD-10-CM

## 2014-01-21 DIAGNOSIS — Z48812 Encounter for surgical aftercare following surgery on the circulatory system: Secondary | ICD-10-CM

## 2014-01-21 NOTE — Progress Notes (Signed)
Established Carotid Patient   History of Present Illness  Jeremiah FassCharles L Brose is a 68 y.o. male patient of Dr. Edilia Boickson who is s/p right carotid endarterectomy in June of 2011 for asymptomatic right carotid stenosis. He comes in for a routine follow up visit. He had a mini stroke in 2011 right before the right CEA, pt states he was told by a friend that he did not look right, was medically evaluated, he does not recall ever having any neurological deficits and has none now. Pt denies claudication symptoms in legs with walking.   Pt reports New Medical or Surgical History arthritis in his lower back  Pt Diabetic: No Pt smoker: smoker  (1/2 ppd, started at age 68, stopped for 9 years , resumed in 1984)  Pt meds include: Statin : Yes ASA: Yes Other anticoagulants/antiplatelets: no   Past Medical History  Diagnosis Date  . Hyperlipidemia   . Hypertension   . CVA (cerebral infarction) 2011    with Left upper extremity weakness  . Carotid artery occlusion March 02, 2010    RIGHT  cea  . Stroke June 2011    Left side weakness    Social History History  Substance Use Topics  . Smoking status: Current Every Day Smoker -- 0.50 packs/day for 12 years    Types: Cigarettes  . Smokeless tobacco: Never Used  . Alcohol Use: 9.6 oz/week    12 Glasses of wine, 4 Shots of liquor per week    Family History Family History  Problem Relation Age of Onset  . Hypertension Mother   . Heart disease Father     Before age 68  . Heart attack Father   . Diabetes Brother     Surgical History Past Surgical History  Procedure Laterality Date  . Carotid endarterectomy  March 02, 2010    RIGHT cea  . Hernia repair  2009    No Known Allergies  Current Outpatient Prescriptions  Medication Sig Dispense Refill  . amLODipine (NORVASC) 5 MG tablet Take 1 tablet (5 mg total) by mouth daily.  90 tablet  1  . aspirin EC 81 MG tablet Take 81 mg by mouth daily.      . fish oil-omega-3 fatty acids 1000 MG  capsule Take 1 g by mouth 2 (two) times daily.       Marland Kitchen. lisinopril (PRINIVIL,ZESTRIL) 40 MG tablet Take 1 tablet (40 mg total) by mouth daily.  90 tablet  1  . rosuvastatin (CRESTOR) 40 MG tablet Take 1 tablet (40 mg total) by mouth daily.  90 tablet  1  . tadalafil (CIALIS) 20 MG tablet Take 1/2 tab 30 minutes prior to anticipated sexual activity for erections.  10 tablet  0   No current facility-administered medications for this visit.    Review of Systems : See HPI for pertinent positives and negatives.  Physical Examination  Filed Vitals:   01/21/14 1047 01/21/14 1049  BP: 119/76 129/80  Pulse: 71 72  Resp:  16  Height:  6\' 1"  (1.854 m)  Weight:  181 lb (82.101 kg)  SpO2:  100%  Body mass index is 23.89 kg/(m^2).   General: WDWN male in NAD GAIT: normal Eyes: PERRLA Pulmonary:  Non-labored, CTAB, Negative  Rales, Negative rhonchi, & Negative wheezing.  Cardiac: regular Rhythm ,  Negative detected murmur.  VASCULAR EXAM Carotid Bruits Left Right   Negative Negative    Aorta is not palpable. Radial pulses are 2+ palpable and equal.  Gastrointestinal: soft, nontender, BS WNL, no r/g,  negative masses.  Musculoskeletal: Negative muscle atrophy/wasting. M/S 5/5 throughout, Extremities without ischemic changes.  Neurologic: A&O X 3; Appropriate Affect ; SENSATION ;normal;  Speech is normal CN 2-12 intact, Pain and light touch intact in extremities, Motor exam as listed above.   Non-Invasive Vascular Imaging CAROTID DUPLEX 01/21/2014   CEREBROVASCULAR DUPLEX EVALUATION    INDICATION: Carotid artery disease     PREVIOUS INTERVENTION(S): Right carotid endarterectomy 03/02/2010.    DUPLEX EXAM:     RIGHT  LEFT  Peak Systolic Velocities (cm/s) End Diastolic Velocities (cm/s) Plaque LOCATION Peak Systolic Velocities (cm/s) End Diastolic Velocities (cm/s)  Plaque  52 10  CCA PROXIMAL 123 26   74 15  CCA MID 112 23   72 13 HM CCA DISTAL 70 17 HT  93 12  ECA 91 11 HT  44 10  ICA PROXIMAL 65 18 HT  64 18  ICA MID 84 26 HT  82 27  ICA DISTAL 74 22      ICA / CCA Ratio (PSV) 0.75  Antegrade  Vertebral Flow Antegrade   150 Brachial Systolic Pressure (mmHg) 145  Triphasic  Brachial Artery Waveforms Triphasic     Plaque Morphology:  HM = Homogeneous, HT = Heterogeneous, CP = Calcific Plaque, SP = Smooth Plaque, IP = Irregular Plaque     ADDITIONAL FINDINGS:     IMPRESSION: Patent right carotid endarterectomy site with no evidence of hyperplasia or restenosis.  Left internal carotid artery velocities suggest a <40% stenosis.     Compared to the previous exam:  No significant change in comparison to the last exam on 01/22/2013.    Assessment: Jeremiah Garcia is a 68 y.o. male  who is s/p right carotid endarterectomy in June of 2011. He has no remote nor recent history of stroke or TIA. Patent right carotid endarterectomy site with no evidence of hyperplasia or restenosis.  Left internal carotid artery velocities suggest a <40% stenosis.  No significant change in comparison to the last exam on 01/22/2013. Unfortunately he continues to smoke but fortunately he does not have DM and has a normal BMI.   Plan: Follow-up in 1 year with Carotid Duplex scan.  I counseled patient re smoking cessation.  I discussed in depth with the patient the nature of atherosclerosis, and emphasized the importance of maximal medical management including strict control of blood pressure, blood glucose, and lipid levels, obtaining regular exercise, and cessation of smoking.  The patient is aware that without maximal medical management the underlying atherosclerotic disease process will progress, limiting the benefit of any interventions. The patient was given information about stroke prevention and what symptoms should prompt the patient to seek immediate medical  care. Thank you for allowing us to participate in this patient's care.  Charisse MarchSuzanne Zoejane Gaulin, RN, MSN, FNP-C [facility] [place] Office: [phone]  Clinic Physician: Edilia BoDickson  01/21/2014 10:55 AM

## 2014-01-21 NOTE — Patient Instructions (Signed)
Stroke Prevention Some medical conditions and behaviors are associated with an increased chance of having a stroke. You may prevent a stroke by making healthy choices and managing medical conditions. HOW CAN I REDUCE MY RISK OF HAVING A STROKE?   Stay physically active. Get at least 30 minutes of activity on most or all days.  Do not smoke. It may also be helpful to avoid exposure to secondhand smoke.  Limit alcohol use. Moderate alcohol use is considered to be:  No more than 2 drinks per day for men.  No more than 1 drink per day for nonpregnant women.  Eat healthy foods. This involves  Eating 5 or more servings of fruits and vegetables a day.  Following a diet that addresses high blood pressure (hypertension), high cholesterol, diabetes, or obesity.  Manage your cholesterol levels.  A diet low in saturated fat, trans fat, and cholesterol and high in fiber may control cholesterol levels.  Take any prescribed medicines to control cholesterol as directed by your health care provider.  Manage your diabetes.  A controlled-carbohydrate, controlled-sugar diet is recommended to manage diabetes.  Take any prescribed medicines to control diabetes as directed by your health care provider.  Control your hypertension.  A low-salt (sodium), low-saturated fat, low-trans fat, and low-cholesterol diet is recommended to manage hypertension.  Take any prescribed medicines to control hypertension as directed by your health care provider.  Maintain a healthy weight.  A reduced-calorie, low-sodium, low-saturated fat, low-trans fat, low-cholesterol diet is recommended to manage weight.  Stop drug abuse.  Avoid taking birth control pills.  Talk to your health care provider about the risks of taking birth control pills if you are over 35 years old, smoke, get migraines, or have ever had a blood clot.  Get evaluated for sleep disorders (sleep apnea).  Talk to your health care provider about  getting a sleep evaluation if you snore a lot or have excessive sleepiness.  Take medicines as directed by your health care provider.  For some people, aspirin or blood thinners (anticoagulants) are helpful in reducing the risk of forming abnormal blood clots that can lead to stroke. If you have the irregular heart rhythm of atrial fibrillation, you should be on a blood thinner unless there is a good reason you cannot take them.  Understand all your medicine instructions.  Make sure that other other conditions (such as anemia or atherosclerosis) are addressed. SEEK IMMEDIATE MEDICAL CARE IF:   You have sudden weakness or numbness of the face, arm, or leg, especially on one side of the body.  Your face or eyelid droops to one side.  You have sudden confusion.  You have trouble speaking (aphasia) or understanding.  You have sudden trouble seeing in one or both eyes.  You have sudden trouble walking.  You have dizziness.  You have a loss of balance or coordination.  You have a sudden, severe headache with no known cause.  You have new chest pain or an irregular heartbeat. Any of these symptoms may represent a serious problem that is an emergency. Do not wait to see if the symptoms will go away. Get medical help at once. Call your local emergency services  (911 in [place] Do not drive yourself to the hospital. Document Released: 10/12/2004 Document Revised: 06/25/2013 Document Reviewed: 03/07/2013 Exit[employer] Patient Information 2014 [employer].   Smoking Cessation Quitting smoking is important to your health and has many advantages. However, it is not always easy to quit since nicotine is a   very addictive drug. Often times, people try 3 times or more before being able to quit. This document explains the best ways for you to prepare to quit smoking. Quitting takes hard work and a lot of effort, but you can do it. ADVANTAGES OF QUITTING SMOKING  You will live longer, feel better,  and live better.  Your body will feel the impact of quitting smoking almost immediately.  Within 20 minutes, blood pressure decreases. Your pulse returns to its normal level.  After 8 hours, carbon monoxide levels in the blood return to normal. Your oxygen level increases.  After 24 hours, the chance of having a heart attack starts to decrease. Your breath, hair, and body stop smelling like smoke.  After 48 hours, damaged nerve endings begin to recover. Your sense of taste and smell improve.  After 72 hours, the body is virtually free of nicotine. Your bronchial tubes relax and breathing becomes easier.  After 2 to 12 weeks, lungs can hold more air. Exercise becomes easier and circulation improves.  The risk of having a heart attack, stroke, cancer, or lung disease is greatly reduced.  After 1 year, the risk of coronary heart disease is cut in half.  After 5 years, the risk of stroke falls to the same as a nonsmoker.  After 10 years, the risk of lung cancer is cut in half and the risk of other cancers decreases significantly.  After 15 years, the risk of coronary heart disease drops, usually to the level of a nonsmoker.  If you are pregnant, quitting smoking will improve your chances of having a healthy baby.  The people you live with, especially any children, will be healthier.  You will have extra money to spend on things other than cigarettes. QUESTIONS TO THINK ABOUT BEFORE ATTEMPTING TO QUIT You may want to talk about your answers with your caregiver.  Why do you want to quit?  If you tried to quit in the past, what helped and what did not?  What will be the most difficult situations for you after you quit? How will you plan to handle them?  Who can help you through the tough times? Your family? Friends? A caregiver?  What pleasures do you get from smoking? What ways can you still get pleasure if you quit? Here are some questions to ask your caregiver:  How can you  help me to be successful at quitting?  What medicine do you think would be best for me and how should I take it?  What should I do if I need more help?  What is smoking withdrawal like? How can I get information on withdrawal? GET READY  Set a quit date.  Change your environment by getting rid of all cigarettes, ashtrays, matches, and lighters in your home, car, or work. Do not let people smoke in your home.  Review your past attempts to quit. Think about what worked and what did not. GET SUPPORT AND ENCOURAGEMENT You have a better chance of being successful if you have help. You can get support in many ways.  Tell your family, friends, and co-workers that you are going to quit and need their support. Ask them not to smoke around you.  Get individual, group, or telephone counseling and support. Programs are available at local hospitals and health centers. Call your local health department for information about programs in your area.  Spiritual beliefs and practices may help some smokers quit.  Download a "quit meter" on your computer   to keep track of quit statistics, such as how long you have gone without smoking, cigarettes not smoked, and money saved.  Get a self-help book about quitting smoking and staying off of tobacco. LEARN NEW SKILLS AND BEHAVIORS  Distract yourself from urges to smoke. Talk to someone, go for a walk, or occupy your time with a task.  Change your normal routine. Take a different route to work. Drink tea instead of coffee. Eat breakfast in a different place.  Reduce your stress. Take a hot bath, exercise, or read a book.  Plan something enjoyable to do every day. Reward yourself for not smoking.  Explore interactive web-based programs that specialize in helping you quit. GET MEDICINE AND USE IT CORRECTLY Medicines can help you stop smoking and decrease the urge to smoke. Combining medicine with the above behavioral methods and support can greatly increase  your chances of successfully quitting smoking.  Nicotine replacement therapy helps deliver nicotine to your body without the negative effects and risks of smoking. Nicotine replacement therapy includes nicotine gum, lozenges, inhalers, nasal sprays, and skin patches. Some may be available over-the-counter and others require a prescription.  Antidepressant medicine helps people abstain from smoking, but how this works is unknown. This medicine is available by prescription.  Nicotinic receptor partial agonist medicine simulates the effect of nicotine in your brain. This medicine is available by prescription. Ask your caregiver for advice about which medicines to use and how to use them based on your health history. Your caregiver will tell you what side effects to look out for if you choose to be on a medicine or therapy. Carefully read the information on the package. Do not use any other product containing nicotine while using a nicotine replacement product.  RELAPSE OR DIFFICULT SITUATIONS Most relapses occur within the first 3 months after quitting. Do not be discouraged if you start smoking again. Remember, most people try several times before finally quitting. You may have symptoms of withdrawal because your body is used to nicotine. You may crave cigarettes, be irritable, feel very hungry, cough often, get headaches, or have difficulty concentrating. The withdrawal symptoms are only temporary. They are strongest when you first quit, but they will go away within 10 14 days. To reduce the chances of relapse, try to:  Avoid drinking alcohol. Drinking lowers your chances of successfully quitting.  Reduce the amount of caffeine you consume. Once you quit smoking, the amount of caffeine in your body increases and can give you symptoms, such as a rapid heartbeat, sweating, and anxiety.  Avoid smokers because they can make you want to smoke.  Do not let weight gain distract you. Many smokers will gain  weight when they quit, usually less than 10 pounds. Eat a healthy diet and stay active. You can always lose the weight gained after you quit.  Find ways to improve your mood other than smoking. FOR MORE INFORMATION  www.smokefree.gov  Document Released: 08/29/2001 Document Revised: 03/05/2012 Document Reviewed: 12/14/2011 ExitCare Patient Information 2014 [employer].  

## 2015-01-26 ENCOUNTER — Encounter: Payer: Self-pay | Admitting: Family

## 2015-01-27 ENCOUNTER — Ambulatory Visit: Payer: PRIVATE HEALTH INSURANCE | Admitting: Family

## 2015-01-27 ENCOUNTER — Ambulatory Visit (HOSPITAL_COMMUNITY): Payer: Medicaid Other

## 2016-01-17 DIAGNOSIS — F1020 Alcohol dependence, uncomplicated: Secondary | ICD-10-CM | POA: Diagnosis not present

## 2016-01-18 DIAGNOSIS — F1020 Alcohol dependence, uncomplicated: Secondary | ICD-10-CM | POA: Diagnosis not present

## 2016-01-19 DIAGNOSIS — F1020 Alcohol dependence, uncomplicated: Secondary | ICD-10-CM | POA: Diagnosis not present

## 2016-01-24 DIAGNOSIS — F1020 Alcohol dependence, uncomplicated: Secondary | ICD-10-CM | POA: Diagnosis not present

## 2016-02-04 DIAGNOSIS — R0602 Shortness of breath: Secondary | ICD-10-CM | POA: Diagnosis not present

## 2016-02-04 DIAGNOSIS — I517 Cardiomegaly: Secondary | ICD-10-CM | POA: Diagnosis not present

## 2016-02-23 DIAGNOSIS — R7303 Prediabetes: Secondary | ICD-10-CM | POA: Diagnosis not present

## 2016-02-23 DIAGNOSIS — Z0000 Encounter for general adult medical examination without abnormal findings: Secondary | ICD-10-CM | POA: Diagnosis not present

## 2016-02-23 DIAGNOSIS — I10 Essential (primary) hypertension: Secondary | ICD-10-CM | POA: Diagnosis not present

## 2016-02-23 DIAGNOSIS — Z5181 Encounter for therapeutic drug level monitoring: Secondary | ICD-10-CM | POA: Diagnosis not present

## 2016-02-23 DIAGNOSIS — E785 Hyperlipidemia, unspecified: Secondary | ICD-10-CM | POA: Diagnosis not present

## 2016-02-23 DIAGNOSIS — Z136 Encounter for screening for cardiovascular disorders: Secondary | ICD-10-CM | POA: Diagnosis not present

## 2016-03-16 DIAGNOSIS — R7303 Prediabetes: Secondary | ICD-10-CM | POA: Diagnosis not present

## 2016-03-16 DIAGNOSIS — Z720 Tobacco use: Secondary | ICD-10-CM | POA: Diagnosis not present

## 2016-03-30 DIAGNOSIS — Z720 Tobacco use: Secondary | ICD-10-CM | POA: Diagnosis not present

## 2016-03-30 DIAGNOSIS — E559 Vitamin D deficiency, unspecified: Secondary | ICD-10-CM | POA: Diagnosis not present

## 2016-03-30 DIAGNOSIS — Z8673 Personal history of transient ischemic attack (TIA), and cerebral infarction without residual deficits: Secondary | ICD-10-CM | POA: Diagnosis not present

## 2016-03-30 DIAGNOSIS — R7303 Prediabetes: Secondary | ICD-10-CM | POA: Diagnosis not present

## 2016-03-30 DIAGNOSIS — E785 Hyperlipidemia, unspecified: Secondary | ICD-10-CM | POA: Diagnosis not present

## 2016-03-30 DIAGNOSIS — I10 Essential (primary) hypertension: Secondary | ICD-10-CM | POA: Diagnosis not present

## 2016-03-30 DIAGNOSIS — Z0000 Encounter for general adult medical examination without abnormal findings: Secondary | ICD-10-CM | POA: Diagnosis not present

## 2016-03-30 DIAGNOSIS — R972 Elevated prostate specific antigen [PSA]: Secondary | ICD-10-CM | POA: Diagnosis not present

## 2016-05-26 DIAGNOSIS — E785 Hyperlipidemia, unspecified: Secondary | ICD-10-CM | POA: Diagnosis not present

## 2016-05-26 DIAGNOSIS — R739 Hyperglycemia, unspecified: Secondary | ICD-10-CM | POA: Diagnosis not present

## 2016-05-26 DIAGNOSIS — E559 Vitamin D deficiency, unspecified: Secondary | ICD-10-CM | POA: Diagnosis not present

## 2016-05-26 DIAGNOSIS — I10 Essential (primary) hypertension: Secondary | ICD-10-CM | POA: Diagnosis not present

## 2016-05-26 DIAGNOSIS — Z8673 Personal history of transient ischemic attack (TIA), and cerebral infarction without residual deficits: Secondary | ICD-10-CM | POA: Diagnosis not present

## 2016-06-16 DIAGNOSIS — M79672 Pain in left foot: Secondary | ICD-10-CM | POA: Diagnosis not present

## 2016-06-16 DIAGNOSIS — L602 Onychogryphosis: Secondary | ICD-10-CM | POA: Diagnosis not present

## 2016-06-16 DIAGNOSIS — M79671 Pain in right foot: Secondary | ICD-10-CM | POA: Diagnosis not present

## 2016-08-14 DIAGNOSIS — Z5181 Encounter for therapeutic drug level monitoring: Secondary | ICD-10-CM | POA: Diagnosis not present

## 2016-08-31 DIAGNOSIS — R739 Hyperglycemia, unspecified: Secondary | ICD-10-CM | POA: Diagnosis not present

## 2016-08-31 DIAGNOSIS — Z8673 Personal history of transient ischemic attack (TIA), and cerebral infarction without residual deficits: Secondary | ICD-10-CM | POA: Diagnosis not present

## 2016-08-31 DIAGNOSIS — I10 Essential (primary) hypertension: Secondary | ICD-10-CM | POA: Diagnosis not present

## 2016-08-31 DIAGNOSIS — E785 Hyperlipidemia, unspecified: Secondary | ICD-10-CM | POA: Diagnosis not present

## 2016-08-31 DIAGNOSIS — E559 Vitamin D deficiency, unspecified: Secondary | ICD-10-CM | POA: Diagnosis not present

## 2016-09-04 DIAGNOSIS — E559 Vitamin D deficiency, unspecified: Secondary | ICD-10-CM | POA: Diagnosis not present

## 2016-09-04 DIAGNOSIS — Z8673 Personal history of transient ischemic attack (TIA), and cerebral infarction without residual deficits: Secondary | ICD-10-CM | POA: Diagnosis not present

## 2016-09-04 DIAGNOSIS — I10 Essential (primary) hypertension: Secondary | ICD-10-CM | POA: Diagnosis not present

## 2016-09-04 DIAGNOSIS — E785 Hyperlipidemia, unspecified: Secondary | ICD-10-CM | POA: Diagnosis not present

## 2016-09-04 DIAGNOSIS — R739 Hyperglycemia, unspecified: Secondary | ICD-10-CM | POA: Diagnosis not present

## 2016-09-14 ENCOUNTER — Ambulatory Visit: Payer: Commercial Managed Care - HMO | Attending: Internal Medicine

## 2016-09-14 DIAGNOSIS — R262 Difficulty in walking, not elsewhere classified: Secondary | ICD-10-CM

## 2016-09-14 DIAGNOSIS — M6281 Muscle weakness (generalized): Secondary | ICD-10-CM | POA: Diagnosis not present

## 2016-09-14 DIAGNOSIS — M25552 Pain in left hip: Secondary | ICD-10-CM | POA: Diagnosis not present

## 2016-09-14 DIAGNOSIS — M25652 Stiffness of left hip, not elsewhere classified: Secondary | ICD-10-CM | POA: Diagnosis not present

## 2016-09-14 NOTE — Patient Instructions (Signed)
From cabinet hip fig 4 stretch , knee to chest and across chest  2x/day 2-3 reps each 20-30 seconds  LT and RT

## 2016-09-14 NOTE — Therapy (Addendum)
[place], [place], [address] Phone: [phone]   Fax:  [phone]  Physical Therapy Evaluation/ Discharge  Patient Details  Name: Jeremiah Garcia MRN: [id number] Date of Birth: 04-17-1946 Referring Provider: Benito Mccreedy  Encounter Date: 09/14/2016      PT End of Session - 10/17/16 1401    Visit Number 1   Number of Visits 12   Date for PT Re-Evaluation 10/27/16   Authorization Type Medicare   PT Start Time 0130   PT Stop Time 0215   PT Time Calculation (min) 45 min   Activity Tolerance Patient tolerated treatment well;No increased pain   Behavior During Therapy WFL for tasks assessed/performed      Past Medical History:  Diagnosis Date  . Carotid artery occlusion March 02, 2010   RIGHT  cea  . CVA (cerebral infarction) 2011   with Left upper extremity weakness  . Hyperlipidemia   . Hypertension   . Stroke [facility]) June 2011   Left side weakness    Past Surgical History:  Procedure Laterality Date  . CAROTID ENDARTERECTOMY  March 02, 2010   RIGHT cea  . HERNIA REPAIR  2009    There were no vitals filed for this visit.       Subjective Assessment - 10/17/16 1357    Subjective He reports LT hip pain with walking and feels like it wants to give. He has had therapy in past. Pain started having pain in 2016 . He was seen in [place] at [facility]. and felt it improved. He had no pain for 9-10 months. until 2 months ago.  He received exercises and worked hip.   He felt no proble   Limitations Walking   How long can you sit comfortably? As needed   How long can you stand comfortably? 60 min   How long can you walk comfortably? Varies by day but does 2 miles per day walking   Patient Stated Goals He wants to have pain resolve   Currently in Pain? No/denies            [facility] PT Assessment - 10/17/16 0001      Assessment   Medical Diagnosis LT hip pain   Onset Date/Surgical Date --  2  months ago   Next MD Visit As needed   Prior Therapy yes with good benefit     Precautions   Precautions None     Restrictions   Weight Bearing Restrictions No     Balance Screen   Has the patient fallen in the past 6 months No   Has the patient had a decrease in activity level because of a fear of falling?  No   Is the patient reluctant to leave their home because of a fear of falling?  No     Prior Function   Level of Independence Independent     Cognition   Overall Cognitive Status Within Functional Limits for tasks assessed     Posture/Postural Control   Posture/Postural Control No significant limitations     AROM   Overall AROM Comments Passive in prone Rotation decreased on LT not RT    Right Hip Extension 5   Right Hip Flexion 120   Right Hip External Rotation  45   Right Hip Internal Rotation  22   Right Hip ABduction 45   Right Hip ADduction 30   Left Hip Extension 0   Left Hip Flexion 110  Left Hip External Rotation  35   Left Hip Internal Rotation  20   Left Hip ABduction 38   Left Hip ADduction 25     Strength   Strength Assessment Site Hip   Right Hip Flexion 4+/5   Right Hip Extension 3+/5   Right Hip External Rotation  5/5   Right Hip Internal Rotation 4+/5   Right Hip ABduction 4/5   Right Hip ADduction 5/5   Left Hip Flexion 4+/5   Left Hip Extension 3+/5   Left Hip External Rotation 5/5   Left Hip Internal Rotation 4+/5   Left Hip ABduction 4-/5   Left Hip ADduction 5/5     Flexibility   Hamstrings 70 degrees bilaterally     Palpation   Palpation comment Mildly tender posterior greater trochantor.      Ambulation/Gait   Gait Comments WNL                           PT Education - 10/17/16 1357    Education provided Yes   Education Details POC stretching   Person(s) Educated Patient   Methods Explanation;Tactile cues;Verbal cues;Handout   Comprehension Returned demonstration;Verbalized understanding           PT Short Term Goals - 09/14/16 1456      PT SHORT TERM GOAL #1   Title He will be independne t with all inital HEP   Time 3   Period Weeks   Status New     PT SHORT TERM GOAL #2   Title He will report decr pain 40% or more with walking   Time 3   Period Weeks   Status New     PT SHORT TERM GOAL #3   Title He will improve all hip strength to 4/5   Time 3   Period Weeks   Status New           PT Long Term Goals - 09/14/16 1457      PT LONG TERM GOAL #1   Title He will be independnet with all HEP issued   Time 6   Period Weeks   Status Achieved     PT LONG TERM GOAL #2   Title He will report no pain for 2 weeks with walking    Time 6   Period Weeks   Status Achieved     PT LONG TERM GOAL #3   Title strength of both hips 4+/5 to improve walking and decr pain.    Time 6   Period Weeks   Status Unable to assess               Plan - 10/17/16 1400    Clinical Impression Statement nd an eval decr hip ROM and weakness   Rehab Potential Good   PT Frequency 2x / week   PT Duration 6 weeks   PT Treatment/Interventions Cryotherapy;Iontophoresis '4mg'$ /ml Dexamethasone;Ultrasound;Passive range of motion;Patient/family education;Taping;Manual techniques;Dry needling;Therapeutic exercise   PT Next Visit Plan Start hip strengthening , manual to LT hip, modalities as needed   PT Home Exercise Plan Hip flexion and adduction and figure 4 stretching     Consulted and Agree with Plan of Care Patient      Patient will benefit from skilled therapeutic intervention in order to improve the following deficits and impairments:  Pain, Decreased range of motion, Difficulty walking, Decreased strength  Visit Diagnosis: Pain in left hip - Plan: PT plan of  care cert/re-cert  Difficulty in walking, not elsewhere classified - Plan: PT plan of care cert/re-cert  Muscle weakness (generalized) - Plan: PT plan of care cert/re-cert  Stiffness of left hip, not elsewhere classified -  Plan: PT plan of care cert/re-cert      G-Codes - 46/56/81 1358    Functional Assessment Tool Used clinical judgement   Functional Limitation Mobility: Walking and moving around   Mobility: Walking and Moving Around Current Status [phone]) At least 40 percent but less than 60 percent impaired, limited or restricted   Mobility: Walking and Moving Around Goal Status [phone]) At least 1 percent but less than 20 percent impaired, limited or restricted   Mobility: Walking and Moving Around Discharge Status [phone]) At least 1 percent but less than 20 percent impaired, limited or restricted       Problem List Patient Active Problem List   Diagnosis Date Noted  . Aftercare following surgery of the circulatory system, [place] 01/21/2014  . EMPHYSEMA 12/09/2010  . ACUTE CYSTITIS 11/16/2010  . COUGH 11/16/2010  . DENTAL CARIES 08/22/2010  . HYPERLIPIDEMIA 05/17/2010  . GLUCOSE INTOLERANCE 05/16/2010  . ESSENTIAL HYPERTENSION, BENIGN 05/16/2010  . TOBACCO ABUSE 03/14/2010  . CAROTID ARTERY STENOSIS, 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    Darrel Hoover  PT 10/17/2016, 2:03 PM  [facility] [place] [address] [place], [place], [address] Phone: [phone]   Fax:  [phone]  Name: Jeremiah Garcia MRN: [id number] Date of Birth: [birth year]  PHYSICAL THERAPY DISCHARGE SUMMARY  Visits from Start of Care: Eval only  Current functional level related to goals / functional outcomes: Mr Gammel report  with exercises his pain /catching has resolved and he is doing fine.   Remaining deficits: None reported though not able to assess strength   Education / Equipment: HEP  Plan: Patient agrees to discharge.  Patient goals were met. Patient is being discharged due to being pleased with the current functional level.  ?????     Lillette Boxer Nyree Yonker  PT   10/17/16    1:49 PM

## 2016-09-19 ENCOUNTER — Ambulatory Visit: Payer: Medicare HMO | Admitting: Physical Therapy

## 2016-09-21 ENCOUNTER — Ambulatory Visit: Payer: Medicare HMO | Attending: Internal Medicine

## 2016-09-21 DIAGNOSIS — M25552 Pain in left hip: Secondary | ICD-10-CM | POA: Insufficient documentation

## 2016-09-26 ENCOUNTER — Ambulatory Visit: Payer: Medicare HMO

## 2016-09-26 ENCOUNTER — Telehealth: Payer: Self-pay

## 2016-09-26 NOTE — Telephone Encounter (Signed)
PT to contact us if he decides to not return for PT

## 2016-09-28 ENCOUNTER — Telehealth: Payer: Self-pay | Admitting: Physical Therapy

## 2016-09-28 ENCOUNTER — Ambulatory Visit: Payer: Medicare HMO | Admitting: Physical Therapy

## 2016-09-28 NOTE — Telephone Encounter (Signed)
Left message on voicemail regarding second no-show appointment. Informed patient, via voicemail, that future appointments will be canceled and for him to call us if he would like to reschedule.

## 2016-10-03 ENCOUNTER — Ambulatory Visit: Payer: Medicare HMO

## 2016-10-05 ENCOUNTER — Ambulatory Visit: Payer: Medicare HMO

## 2016-10-10 ENCOUNTER — Ambulatory Visit: Payer: Medicare HMO | Admitting: Physical Therapy

## 2016-10-12 ENCOUNTER — Ambulatory Visit: Payer: Medicare HMO | Admitting: Physical Therapy

## 2016-10-17 ENCOUNTER — Ambulatory Visit: Payer: Medicare HMO

## 2016-10-17 ENCOUNTER — Telehealth: Payer: Self-pay

## 2016-10-17 DIAGNOSIS — M25552 Pain in left hip: Secondary | ICD-10-CM | POA: Diagnosis not present

## 2016-10-17 NOTE — Telephone Encounter (Signed)
Spoke to Mr Kevan NyGates and he reports he is doing fine with problem resolved

## 2016-12-21 DIAGNOSIS — Z131 Encounter for screening for diabetes mellitus: Secondary | ICD-10-CM | POA: Diagnosis not present

## 2016-12-21 DIAGNOSIS — Z0000 Encounter for general adult medical examination without abnormal findings: Secondary | ICD-10-CM | POA: Diagnosis not present

## 2016-12-21 DIAGNOSIS — Z8673 Personal history of transient ischemic attack (TIA), and cerebral infarction without residual deficits: Secondary | ICD-10-CM | POA: Diagnosis not present

## 2016-12-21 DIAGNOSIS — E785 Hyperlipidemia, unspecified: Secondary | ICD-10-CM | POA: Diagnosis not present

## 2016-12-21 DIAGNOSIS — I10 Essential (primary) hypertension: Secondary | ICD-10-CM | POA: Diagnosis not present

## 2016-12-21 DIAGNOSIS — R7303 Prediabetes: Secondary | ICD-10-CM | POA: Diagnosis not present

## 2016-12-21 DIAGNOSIS — H538 Other visual disturbances: Secondary | ICD-10-CM | POA: Diagnosis not present

## 2016-12-21 DIAGNOSIS — E559 Vitamin D deficiency, unspecified: Secondary | ICD-10-CM | POA: Diagnosis not present

## 2016-12-21 DIAGNOSIS — Z01118 Encounter for examination of ears and hearing with other abnormal findings: Secondary | ICD-10-CM | POA: Diagnosis not present

## 2017-01-03 DIAGNOSIS — I10 Essential (primary) hypertension: Secondary | ICD-10-CM | POA: Diagnosis not present

## 2017-01-03 DIAGNOSIS — E782 Mixed hyperlipidemia: Secondary | ICD-10-CM | POA: Diagnosis not present

## 2017-01-03 DIAGNOSIS — Z1211 Encounter for screening for malignant neoplasm of colon: Secondary | ICD-10-CM | POA: Diagnosis not present

## 2017-03-01 DIAGNOSIS — Z0000 Encounter for general adult medical examination without abnormal findings: Secondary | ICD-10-CM | POA: Diagnosis not present

## 2017-03-01 DIAGNOSIS — I10 Essential (primary) hypertension: Secondary | ICD-10-CM | POA: Diagnosis not present

## 2017-03-01 DIAGNOSIS — Z131 Encounter for screening for diabetes mellitus: Secondary | ICD-10-CM | POA: Diagnosis not present

## 2017-03-01 DIAGNOSIS — E785 Hyperlipidemia, unspecified: Secondary | ICD-10-CM | POA: Diagnosis not present

## 2017-03-01 DIAGNOSIS — Z720 Tobacco use: Secondary | ICD-10-CM | POA: Diagnosis not present

## 2017-03-01 DIAGNOSIS — E559 Vitamin D deficiency, unspecified: Secondary | ICD-10-CM | POA: Diagnosis not present

## 2017-03-01 DIAGNOSIS — Z8673 Personal history of transient ischemic attack (TIA), and cerebral infarction without residual deficits: Secondary | ICD-10-CM | POA: Diagnosis not present

## 2017-03-01 DIAGNOSIS — Z125 Encounter for screening for malignant neoplasm of prostate: Secondary | ICD-10-CM | POA: Diagnosis not present

## 2017-03-01 DIAGNOSIS — R7303 Prediabetes: Secondary | ICD-10-CM | POA: Diagnosis not present

## 2017-04-12 DIAGNOSIS — R739 Hyperglycemia, unspecified: Secondary | ICD-10-CM | POA: Diagnosis not present

## 2017-04-12 DIAGNOSIS — E785 Hyperlipidemia, unspecified: Secondary | ICD-10-CM | POA: Diagnosis not present

## 2017-04-12 DIAGNOSIS — R7303 Prediabetes: Secondary | ICD-10-CM | POA: Diagnosis not present

## 2017-04-12 DIAGNOSIS — Z8673 Personal history of transient ischemic attack (TIA), and cerebral infarction without residual deficits: Secondary | ICD-10-CM | POA: Diagnosis not present

## 2017-04-12 DIAGNOSIS — I10 Essential (primary) hypertension: Secondary | ICD-10-CM | POA: Diagnosis not present

## 2017-04-12 DIAGNOSIS — Z720 Tobacco use: Secondary | ICD-10-CM | POA: Diagnosis not present

## 2017-04-12 DIAGNOSIS — E559 Vitamin D deficiency, unspecified: Secondary | ICD-10-CM | POA: Diagnosis not present

## 2017-08-07 DIAGNOSIS — E559 Vitamin D deficiency, unspecified: Secondary | ICD-10-CM | POA: Diagnosis not present

## 2017-08-07 DIAGNOSIS — Z720 Tobacco use: Secondary | ICD-10-CM | POA: Diagnosis not present

## 2017-08-07 DIAGNOSIS — I10 Essential (primary) hypertension: Secondary | ICD-10-CM | POA: Diagnosis not present

## 2017-08-07 DIAGNOSIS — R7303 Prediabetes: Secondary | ICD-10-CM | POA: Diagnosis not present

## 2017-08-07 DIAGNOSIS — E785 Hyperlipidemia, unspecified: Secondary | ICD-10-CM | POA: Diagnosis not present

## 2017-08-07 DIAGNOSIS — Z8673 Personal history of transient ischemic attack (TIA), and cerebral infarction without residual deficits: Secondary | ICD-10-CM | POA: Diagnosis not present

## 2017-09-04 DIAGNOSIS — Z5181 Encounter for therapeutic drug level monitoring: Secondary | ICD-10-CM | POA: Diagnosis not present

## 2017-09-04 DIAGNOSIS — Z79899 Other long term (current) drug therapy: Secondary | ICD-10-CM | POA: Diagnosis not present

## 2017-09-06 DIAGNOSIS — Z79899 Other long term (current) drug therapy: Secondary | ICD-10-CM | POA: Diagnosis not present

## 2017-09-06 DIAGNOSIS — Z5181 Encounter for therapeutic drug level monitoring: Secondary | ICD-10-CM | POA: Diagnosis not present

## 2017-09-12 DIAGNOSIS — Z5181 Encounter for therapeutic drug level monitoring: Secondary | ICD-10-CM | POA: Diagnosis not present

## 2017-09-12 DIAGNOSIS — Z79899 Other long term (current) drug therapy: Secondary | ICD-10-CM | POA: Diagnosis not present

## 2017-09-19 DIAGNOSIS — Z79899 Other long term (current) drug therapy: Secondary | ICD-10-CM | POA: Diagnosis not present

## 2017-09-19 DIAGNOSIS — Z5181 Encounter for therapeutic drug level monitoring: Secondary | ICD-10-CM | POA: Diagnosis not present

## 2017-09-21 DIAGNOSIS — Z79899 Other long term (current) drug therapy: Secondary | ICD-10-CM | POA: Diagnosis not present

## 2017-09-21 DIAGNOSIS — Z5181 Encounter for therapeutic drug level monitoring: Secondary | ICD-10-CM | POA: Diagnosis not present

## 2017-10-09 DIAGNOSIS — Z131 Encounter for screening for diabetes mellitus: Secondary | ICD-10-CM | POA: Diagnosis not present

## 2017-10-09 DIAGNOSIS — E559 Vitamin D deficiency, unspecified: Secondary | ICD-10-CM | POA: Diagnosis not present

## 2017-10-09 DIAGNOSIS — Z01118 Encounter for examination of ears and hearing with other abnormal findings: Secondary | ICD-10-CM | POA: Diagnosis not present

## 2017-10-09 DIAGNOSIS — Z720 Tobacco use: Secondary | ICD-10-CM | POA: Diagnosis not present

## 2017-10-09 DIAGNOSIS — R7303 Prediabetes: Secondary | ICD-10-CM | POA: Diagnosis not present

## 2017-10-09 DIAGNOSIS — E785 Hyperlipidemia, unspecified: Secondary | ICD-10-CM | POA: Diagnosis not present

## 2017-10-09 DIAGNOSIS — H538 Other visual disturbances: Secondary | ICD-10-CM | POA: Diagnosis not present

## 2017-10-09 DIAGNOSIS — Z79899 Other long term (current) drug therapy: Secondary | ICD-10-CM | POA: Diagnosis not present

## 2017-10-09 DIAGNOSIS — Z5181 Encounter for therapeutic drug level monitoring: Secondary | ICD-10-CM | POA: Diagnosis not present

## 2017-10-09 DIAGNOSIS — Z0000 Encounter for general adult medical examination without abnormal findings: Secondary | ICD-10-CM | POA: Diagnosis not present

## 2017-10-09 DIAGNOSIS — R972 Elevated prostate specific antigen [PSA]: Secondary | ICD-10-CM | POA: Diagnosis not present

## 2017-10-09 DIAGNOSIS — I10 Essential (primary) hypertension: Secondary | ICD-10-CM | POA: Diagnosis not present

## 2017-10-09 DIAGNOSIS — Z8673 Personal history of transient ischemic attack (TIA), and cerebral infarction without residual deficits: Secondary | ICD-10-CM | POA: Diagnosis not present

## 2017-10-11 DIAGNOSIS — Z79899 Other long term (current) drug therapy: Secondary | ICD-10-CM | POA: Diagnosis not present

## 2017-10-11 DIAGNOSIS — Z5181 Encounter for therapeutic drug level monitoring: Secondary | ICD-10-CM | POA: Diagnosis not present

## 2017-10-16 DIAGNOSIS — Z79899 Other long term (current) drug therapy: Secondary | ICD-10-CM | POA: Diagnosis not present

## 2017-10-16 DIAGNOSIS — Z5181 Encounter for therapeutic drug level monitoring: Secondary | ICD-10-CM | POA: Diagnosis not present

## 2017-10-18 DIAGNOSIS — Z79899 Other long term (current) drug therapy: Secondary | ICD-10-CM | POA: Diagnosis not present

## 2017-10-18 DIAGNOSIS — Z5181 Encounter for therapeutic drug level monitoring: Secondary | ICD-10-CM | POA: Diagnosis not present

## 2017-10-22 DIAGNOSIS — Z5181 Encounter for therapeutic drug level monitoring: Secondary | ICD-10-CM | POA: Diagnosis not present

## 2017-10-22 DIAGNOSIS — Z79899 Other long term (current) drug therapy: Secondary | ICD-10-CM | POA: Diagnosis not present

## 2017-10-24 DIAGNOSIS — Z79899 Other long term (current) drug therapy: Secondary | ICD-10-CM | POA: Diagnosis not present

## 2017-10-24 DIAGNOSIS — Z5181 Encounter for therapeutic drug level monitoring: Secondary | ICD-10-CM | POA: Diagnosis not present

## 2017-10-29 DIAGNOSIS — R350 Frequency of micturition: Secondary | ICD-10-CM | POA: Diagnosis not present

## 2017-10-29 DIAGNOSIS — R972 Elevated prostate specific antigen [PSA]: Secondary | ICD-10-CM | POA: Diagnosis not present

## 2017-10-29 DIAGNOSIS — N401 Enlarged prostate with lower urinary tract symptoms: Secondary | ICD-10-CM | POA: Diagnosis not present

## 2017-10-30 DIAGNOSIS — Z5181 Encounter for therapeutic drug level monitoring: Secondary | ICD-10-CM | POA: Diagnosis not present

## 2017-10-30 DIAGNOSIS — Z79899 Other long term (current) drug therapy: Secondary | ICD-10-CM | POA: Diagnosis not present

## 2017-11-01 DIAGNOSIS — Z5181 Encounter for therapeutic drug level monitoring: Secondary | ICD-10-CM | POA: Diagnosis not present

## 2017-11-01 DIAGNOSIS — Z79899 Other long term (current) drug therapy: Secondary | ICD-10-CM | POA: Diagnosis not present

## 2017-11-06 DIAGNOSIS — Z5181 Encounter for therapeutic drug level monitoring: Secondary | ICD-10-CM | POA: Diagnosis not present

## 2017-11-06 DIAGNOSIS — Z79899 Other long term (current) drug therapy: Secondary | ICD-10-CM | POA: Diagnosis not present

## 2017-11-08 DIAGNOSIS — Z79899 Other long term (current) drug therapy: Secondary | ICD-10-CM | POA: Diagnosis not present

## 2017-11-08 DIAGNOSIS — Z5181 Encounter for therapeutic drug level monitoring: Secondary | ICD-10-CM | POA: Diagnosis not present

## 2017-11-13 DIAGNOSIS — Z5181 Encounter for therapeutic drug level monitoring: Secondary | ICD-10-CM | POA: Diagnosis not present

## 2017-11-13 DIAGNOSIS — Z79899 Other long term (current) drug therapy: Secondary | ICD-10-CM | POA: Diagnosis not present

## 2017-11-15 DIAGNOSIS — Z5181 Encounter for therapeutic drug level monitoring: Secondary | ICD-10-CM | POA: Diagnosis not present

## 2017-11-15 DIAGNOSIS — Z79899 Other long term (current) drug therapy: Secondary | ICD-10-CM | POA: Diagnosis not present

## 2017-11-20 DIAGNOSIS — Z5181 Encounter for therapeutic drug level monitoring: Secondary | ICD-10-CM | POA: Diagnosis not present

## 2017-11-20 DIAGNOSIS — Z79899 Other long term (current) drug therapy: Secondary | ICD-10-CM | POA: Diagnosis not present

## 2017-11-22 DIAGNOSIS — Z79899 Other long term (current) drug therapy: Secondary | ICD-10-CM | POA: Diagnosis not present

## 2017-11-22 DIAGNOSIS — Z5181 Encounter for therapeutic drug level monitoring: Secondary | ICD-10-CM | POA: Diagnosis not present

## 2017-11-27 DIAGNOSIS — R7303 Prediabetes: Secondary | ICD-10-CM | POA: Diagnosis not present

## 2017-11-27 DIAGNOSIS — Z5181 Encounter for therapeutic drug level monitoring: Secondary | ICD-10-CM | POA: Diagnosis not present

## 2017-11-27 DIAGNOSIS — E785 Hyperlipidemia, unspecified: Secondary | ICD-10-CM | POA: Diagnosis not present

## 2017-11-27 DIAGNOSIS — E559 Vitamin D deficiency, unspecified: Secondary | ICD-10-CM | POA: Diagnosis not present

## 2017-11-27 DIAGNOSIS — Z79899 Other long term (current) drug therapy: Secondary | ICD-10-CM | POA: Diagnosis not present

## 2017-11-27 DIAGNOSIS — I10 Essential (primary) hypertension: Secondary | ICD-10-CM | POA: Diagnosis not present

## 2017-11-27 DIAGNOSIS — Z8673 Personal history of transient ischemic attack (TIA), and cerebral infarction without residual deficits: Secondary | ICD-10-CM | POA: Diagnosis not present

## 2017-11-29 DIAGNOSIS — Z5181 Encounter for therapeutic drug level monitoring: Secondary | ICD-10-CM | POA: Diagnosis not present

## 2017-11-29 DIAGNOSIS — Z79899 Other long term (current) drug therapy: Secondary | ICD-10-CM | POA: Diagnosis not present

## 2017-12-31 DIAGNOSIS — E559 Vitamin D deficiency, unspecified: Secondary | ICD-10-CM | POA: Diagnosis not present

## 2017-12-31 DIAGNOSIS — Z8673 Personal history of transient ischemic attack (TIA), and cerebral infarction without residual deficits: Secondary | ICD-10-CM | POA: Diagnosis not present

## 2017-12-31 DIAGNOSIS — E785 Hyperlipidemia, unspecified: Secondary | ICD-10-CM | POA: Diagnosis not present

## 2017-12-31 DIAGNOSIS — R7303 Prediabetes: Secondary | ICD-10-CM | POA: Diagnosis not present

## 2017-12-31 DIAGNOSIS — I10 Essential (primary) hypertension: Secondary | ICD-10-CM | POA: Diagnosis not present

## 2018-02-18 DIAGNOSIS — Z5181 Encounter for therapeutic drug level monitoring: Secondary | ICD-10-CM | POA: Diagnosis not present

## 2018-02-18 DIAGNOSIS — Z79899 Other long term (current) drug therapy: Secondary | ICD-10-CM | POA: Diagnosis not present

## 2018-02-20 DIAGNOSIS — Z5181 Encounter for therapeutic drug level monitoring: Secondary | ICD-10-CM | POA: Diagnosis not present

## 2018-02-20 DIAGNOSIS — Z79899 Other long term (current) drug therapy: Secondary | ICD-10-CM | POA: Diagnosis not present

## 2018-02-23 DIAGNOSIS — H5213 Myopia, bilateral: Secondary | ICD-10-CM | POA: Diagnosis not present

## 2018-02-26 DIAGNOSIS — I10 Essential (primary) hypertension: Secondary | ICD-10-CM | POA: Diagnosis not present

## 2018-02-26 DIAGNOSIS — R972 Elevated prostate specific antigen [PSA]: Secondary | ICD-10-CM | POA: Diagnosis not present

## 2018-02-26 DIAGNOSIS — E785 Hyperlipidemia, unspecified: Secondary | ICD-10-CM | POA: Diagnosis not present

## 2018-02-26 DIAGNOSIS — J4540 Moderate persistent asthma, uncomplicated: Secondary | ICD-10-CM | POA: Diagnosis not present

## 2018-02-26 DIAGNOSIS — R7303 Prediabetes: Secondary | ICD-10-CM | POA: Diagnosis not present

## 2018-02-26 DIAGNOSIS — Z8673 Personal history of transient ischemic attack (TIA), and cerebral infarction without residual deficits: Secondary | ICD-10-CM | POA: Diagnosis not present

## 2018-02-26 DIAGNOSIS — E559 Vitamin D deficiency, unspecified: Secondary | ICD-10-CM | POA: Diagnosis not present

## 2018-03-05 DIAGNOSIS — Z0000 Encounter for general adult medical examination without abnormal findings: Secondary | ICD-10-CM | POA: Diagnosis not present

## 2018-03-05 DIAGNOSIS — I10 Essential (primary) hypertension: Secondary | ICD-10-CM | POA: Diagnosis not present

## 2018-03-05 DIAGNOSIS — Z8673 Personal history of transient ischemic attack (TIA), and cerebral infarction without residual deficits: Secondary | ICD-10-CM | POA: Diagnosis not present

## 2018-03-07 DIAGNOSIS — M5489 Other dorsalgia: Secondary | ICD-10-CM | POA: Diagnosis not present

## 2018-03-18 DIAGNOSIS — Z5181 Encounter for therapeutic drug level monitoring: Secondary | ICD-10-CM | POA: Diagnosis not present

## 2018-03-18 DIAGNOSIS — Z79899 Other long term (current) drug therapy: Secondary | ICD-10-CM | POA: Diagnosis not present

## 2018-03-19 DIAGNOSIS — L600 Ingrowing nail: Secondary | ICD-10-CM | POA: Diagnosis not present

## 2018-03-19 DIAGNOSIS — I739 Peripheral vascular disease, unspecified: Secondary | ICD-10-CM | POA: Diagnosis not present

## 2018-03-19 DIAGNOSIS — L603 Nail dystrophy: Secondary | ICD-10-CM | POA: Diagnosis not present

## 2018-03-20 DIAGNOSIS — Z5181 Encounter for therapeutic drug level monitoring: Secondary | ICD-10-CM | POA: Diagnosis not present

## 2018-03-20 DIAGNOSIS — Z79899 Other long term (current) drug therapy: Secondary | ICD-10-CM | POA: Diagnosis not present

## 2018-03-26 DIAGNOSIS — Z5181 Encounter for therapeutic drug level monitoring: Secondary | ICD-10-CM | POA: Diagnosis not present

## 2018-04-01 DIAGNOSIS — Z5181 Encounter for therapeutic drug level monitoring: Secondary | ICD-10-CM | POA: Diagnosis not present

## 2018-04-03 DIAGNOSIS — Z5181 Encounter for therapeutic drug level monitoring: Secondary | ICD-10-CM | POA: Diagnosis not present

## 2018-05-01 DIAGNOSIS — Z5181 Encounter for therapeutic drug level monitoring: Secondary | ICD-10-CM | POA: Diagnosis not present

## 2018-05-28 DIAGNOSIS — E785 Hyperlipidemia, unspecified: Secondary | ICD-10-CM | POA: Diagnosis not present

## 2018-05-28 DIAGNOSIS — R7303 Prediabetes: Secondary | ICD-10-CM | POA: Diagnosis not present

## 2018-05-28 DIAGNOSIS — E559 Vitamin D deficiency, unspecified: Secondary | ICD-10-CM | POA: Diagnosis not present

## 2018-05-28 DIAGNOSIS — I10 Essential (primary) hypertension: Secondary | ICD-10-CM | POA: Diagnosis not present

## 2018-05-28 DIAGNOSIS — Z8673 Personal history of transient ischemic attack (TIA), and cerebral infarction without residual deficits: Secondary | ICD-10-CM | POA: Diagnosis not present

## 2018-06-04 DIAGNOSIS — I10 Essential (primary) hypertension: Secondary | ICD-10-CM | POA: Diagnosis not present

## 2018-06-04 DIAGNOSIS — Z8673 Personal history of transient ischemic attack (TIA), and cerebral infarction without residual deficits: Secondary | ICD-10-CM | POA: Diagnosis not present

## 2018-06-04 DIAGNOSIS — E785 Hyperlipidemia, unspecified: Secondary | ICD-10-CM | POA: Diagnosis not present

## 2018-06-04 DIAGNOSIS — E559 Vitamin D deficiency, unspecified: Secondary | ICD-10-CM | POA: Diagnosis not present

## 2018-06-04 DIAGNOSIS — J4540 Moderate persistent asthma, uncomplicated: Secondary | ICD-10-CM | POA: Diagnosis not present

## 2018-06-26 DIAGNOSIS — I739 Peripheral vascular disease, unspecified: Secondary | ICD-10-CM | POA: Diagnosis not present

## 2018-06-26 DIAGNOSIS — B351 Tinea unguium: Secondary | ICD-10-CM | POA: Diagnosis not present

## 2018-08-05 ENCOUNTER — Other Ambulatory Visit: Payer: Self-pay

## 2018-08-05 NOTE — Patient Outreach (Signed)
[facility] Care Management  08/05/2018  Tessie FassCharles L Bolick 1946/02/05 [id number]   Medication Adherence call to Mr. Jennette BillCharles Beck left a message for patient to call back patient is due on Rosuvastatin 40 mg and Lisinopril/HCTZ 20/25 mg. Mr. Kevan NyGates is showing pats [facility]   Lillia AbedAna Ollison-Moran CPhT Pharmacy Technician [facility] Care Management Direct Dial [phone]  Fax [phone] [email or web address]

## 2018-08-27 DIAGNOSIS — E559 Vitamin D deficiency, unspecified: Secondary | ICD-10-CM | POA: Diagnosis not present

## 2018-08-27 DIAGNOSIS — R7303 Prediabetes: Secondary | ICD-10-CM | POA: Diagnosis not present

## 2018-08-27 DIAGNOSIS — Z8673 Personal history of transient ischemic attack (TIA), and cerebral infarction without residual deficits: Secondary | ICD-10-CM | POA: Diagnosis not present

## 2018-08-27 DIAGNOSIS — J4540 Moderate persistent asthma, uncomplicated: Secondary | ICD-10-CM | POA: Diagnosis not present

## 2018-08-27 DIAGNOSIS — I10 Essential (primary) hypertension: Secondary | ICD-10-CM | POA: Diagnosis not present

## 2018-09-24 DIAGNOSIS — I10 Essential (primary) hypertension: Secondary | ICD-10-CM | POA: Diagnosis not present

## 2018-09-24 DIAGNOSIS — Z0001 Encounter for general adult medical examination with abnormal findings: Secondary | ICD-10-CM | POA: Diagnosis not present

## 2018-09-24 DIAGNOSIS — Z0000 Encounter for general adult medical examination without abnormal findings: Secondary | ICD-10-CM | POA: Diagnosis not present

## 2018-09-24 DIAGNOSIS — D500 Iron deficiency anemia secondary to blood loss (chronic): Secondary | ICD-10-CM | POA: Diagnosis not present

## 2018-09-24 DIAGNOSIS — E782 Mixed hyperlipidemia: Secondary | ICD-10-CM | POA: Diagnosis not present

## 2018-09-25 DIAGNOSIS — E870 Hyperosmolality and hypernatremia: Secondary | ICD-10-CM | POA: Diagnosis not present

## 2018-09-25 DIAGNOSIS — E785 Hyperlipidemia, unspecified: Secondary | ICD-10-CM | POA: Diagnosis not present

## 2018-09-25 DIAGNOSIS — E860 Dehydration: Secondary | ICD-10-CM | POA: Diagnosis not present

## 2018-09-25 DIAGNOSIS — R93421 Abnormal radiologic findings on diagnostic imaging of right kidney: Secondary | ICD-10-CM | POA: Diagnosis not present

## 2018-09-25 DIAGNOSIS — R0602 Shortness of breath: Secondary | ICD-10-CM | POA: Diagnosis not present

## 2018-09-25 DIAGNOSIS — E876 Hypokalemia: Secondary | ICD-10-CM | POA: Diagnosis not present

## 2018-09-25 DIAGNOSIS — R05 Cough: Secondary | ICD-10-CM | POA: Diagnosis not present

## 2018-09-25 DIAGNOSIS — N189 Chronic kidney disease, unspecified: Secondary | ICD-10-CM | POA: Diagnosis not present

## 2018-09-25 DIAGNOSIS — E119 Type 2 diabetes mellitus without complications: Secondary | ICD-10-CM | POA: Diagnosis not present

## 2018-09-25 DIAGNOSIS — E1165 Type 2 diabetes mellitus with hyperglycemia: Secondary | ICD-10-CM | POA: Diagnosis not present

## 2018-09-25 DIAGNOSIS — I10 Essential (primary) hypertension: Secondary | ICD-10-CM | POA: Diagnosis not present

## 2018-09-25 DIAGNOSIS — N179 Acute kidney failure, unspecified: Secondary | ICD-10-CM | POA: Diagnosis not present

## 2018-09-25 DIAGNOSIS — R410 Disorientation, unspecified: Secondary | ICD-10-CM | POA: Diagnosis not present

## 2018-09-25 DIAGNOSIS — I959 Hypotension, unspecified: Secondary | ICD-10-CM | POA: Diagnosis not present

## 2018-09-25 DIAGNOSIS — E869 Volume depletion, unspecified: Secondary | ICD-10-CM | POA: Diagnosis not present

## 2018-09-25 DIAGNOSIS — D593 Hemolytic-uremic syndrome: Secondary | ICD-10-CM | POA: Diagnosis not present

## 2018-10-10 DIAGNOSIS — E782 Mixed hyperlipidemia: Secondary | ICD-10-CM | POA: Diagnosis not present

## 2018-10-10 DIAGNOSIS — I10 Essential (primary) hypertension: Secondary | ICD-10-CM | POA: Diagnosis not present

## 2018-11-21 DIAGNOSIS — E782 Mixed hyperlipidemia: Secondary | ICD-10-CM | POA: Diagnosis not present

## 2018-11-21 DIAGNOSIS — I10 Essential (primary) hypertension: Secondary | ICD-10-CM | POA: Diagnosis not present

## 2019-02-20 DIAGNOSIS — Z1159 Encounter for screening for other viral diseases: Secondary | ICD-10-CM | POA: Diagnosis not present

## 2019-02-20 DIAGNOSIS — E782 Mixed hyperlipidemia: Secondary | ICD-10-CM | POA: Diagnosis not present

## 2019-02-20 DIAGNOSIS — I10 Essential (primary) hypertension: Secondary | ICD-10-CM | POA: Diagnosis not present

## 2020-06-18 ENCOUNTER — Encounter (HOSPITAL_COMMUNITY): Payer: Self-pay | Admitting: Emergency Medicine

## 2020-06-18 DIAGNOSIS — F17210 Nicotine dependence, cigarettes, uncomplicated: Secondary | ICD-10-CM | POA: Diagnosis present

## 2020-06-18 DIAGNOSIS — B1920 Unspecified viral hepatitis C without hepatic coma: Secondary | ICD-10-CM | POA: Diagnosis present

## 2020-06-18 DIAGNOSIS — Z5900 Homelessness unspecified: Secondary | ICD-10-CM

## 2020-06-18 DIAGNOSIS — Z20822 Contact with and (suspected) exposure to covid-19: Secondary | ICD-10-CM | POA: Diagnosis present

## 2020-06-18 DIAGNOSIS — E785 Hyperlipidemia, unspecified: Secondary | ICD-10-CM | POA: Diagnosis present

## 2020-06-18 DIAGNOSIS — Y900 Blood alcohol level of less than 20 mg/100 ml: Secondary | ICD-10-CM | POA: Diagnosis present

## 2020-06-18 DIAGNOSIS — F0390 Unspecified dementia without behavioral disturbance: Secondary | ICD-10-CM | POA: Diagnosis present

## 2020-06-18 DIAGNOSIS — R809 Proteinuria, unspecified: Secondary | ICD-10-CM | POA: Diagnosis present

## 2020-06-18 DIAGNOSIS — R319 Hematuria, unspecified: Secondary | ICD-10-CM | POA: Diagnosis present

## 2020-06-18 DIAGNOSIS — E860 Dehydration: Secondary | ICD-10-CM | POA: Diagnosis present

## 2020-06-18 DIAGNOSIS — Z7982 Long term (current) use of aspirin: Secondary | ICD-10-CM

## 2020-06-18 DIAGNOSIS — J189 Pneumonia, unspecified organism: Secondary | ICD-10-CM | POA: Diagnosis present

## 2020-06-18 DIAGNOSIS — Z87442 Personal history of urinary calculi: Secondary | ICD-10-CM

## 2020-06-18 DIAGNOSIS — R6510 Systemic inflammatory response syndrome (SIRS) of non-infectious origin without acute organ dysfunction: Secondary | ICD-10-CM | POA: Diagnosis not present

## 2020-06-18 DIAGNOSIS — R55 Syncope and collapse: Secondary | ICD-10-CM | POA: Diagnosis present

## 2020-06-18 DIAGNOSIS — A419 Sepsis, unspecified organism: Secondary | ICD-10-CM | POA: Diagnosis not present

## 2020-06-18 DIAGNOSIS — R32 Unspecified urinary incontinence: Secondary | ICD-10-CM | POA: Diagnosis present

## 2020-06-18 DIAGNOSIS — Z8249 Family history of ischemic heart disease and other diseases of the circulatory system: Secondary | ICD-10-CM

## 2020-06-18 DIAGNOSIS — E876 Hypokalemia: Secondary | ICD-10-CM | POA: Diagnosis present

## 2020-06-18 DIAGNOSIS — R350 Frequency of micturition: Secondary | ICD-10-CM | POA: Diagnosis present

## 2020-06-18 DIAGNOSIS — J9811 Atelectasis: Secondary | ICD-10-CM | POA: Diagnosis present

## 2020-06-18 DIAGNOSIS — R159 Full incontinence of feces: Secondary | ICD-10-CM | POA: Diagnosis present

## 2020-06-18 DIAGNOSIS — I69354 Hemiplegia and hemiparesis following cerebral infarction affecting left non-dominant side: Secondary | ICD-10-CM

## 2020-06-18 DIAGNOSIS — Z79899 Other long term (current) drug therapy: Secondary | ICD-10-CM

## 2020-06-18 DIAGNOSIS — Z833 Family history of diabetes mellitus: Secondary | ICD-10-CM

## 2020-06-18 DIAGNOSIS — I10 Essential (primary) hypertension: Secondary | ICD-10-CM | POA: Diagnosis present

## 2020-06-18 DIAGNOSIS — F10239 Alcohol dependence with withdrawal, unspecified: Secondary | ICD-10-CM | POA: Diagnosis present

## 2020-06-18 DIAGNOSIS — R3915 Urgency of urination: Secondary | ICD-10-CM | POA: Diagnosis present

## 2020-06-18 LAB — CBC WITH DIFFERENTIAL/PLATELET
Abs Immature Granulocytes: 0.06 10*3/uL (ref 0.00–0.07)
Basophils Absolute: 0 10*3/uL (ref 0.0–0.1)
Basophils Relative: 0 %
Eosinophils Absolute: 0 10*3/uL (ref 0.0–0.5)
Eosinophils Relative: 0 %
HCT: 42.2 % (ref 39.0–52.0)
Hemoglobin: 13.9 g/dL (ref 13.0–17.0)
Immature Granulocytes: 1 %
Lymphocytes Relative: 5 %
Lymphs Abs: 0.6 10*3/uL — ABNORMAL LOW (ref 0.7–4.0)
MCH: 28.3 pg (ref 26.0–34.0)
MCHC: 32.9 g/dL (ref 30.0–36.0)
MCV: 85.9 fL (ref 80.0–100.0)
Monocytes Absolute: 1.3 10*3/uL — ABNORMAL HIGH (ref 0.1–1.0)
Monocytes Relative: 10 %
Neutro Abs: 10.7 10*3/uL — ABNORMAL HIGH (ref 1.7–7.7)
Neutrophils Relative %: 84 %
Platelets: 211 10*3/uL (ref 150–400)
RBC: 4.91 MIL/uL (ref 4.22–5.81)
RDW: 15 % (ref 11.5–15.5)
WBC: 12.7 10*3/uL — ABNORMAL HIGH (ref 4.0–10.5)
nRBC: 0 % (ref 0.0–0.2)

## 2020-06-18 LAB — COMPREHENSIVE METABOLIC PANEL
ALT: 23 U/L (ref 0–44)
AST: 48 U/L — ABNORMAL HIGH (ref 15–41)
Albumin: 3.2 g/dL — ABNORMAL LOW (ref 3.5–5.0)
Alkaline Phosphatase: 68 U/L (ref 38–126)
Anion gap: 10 (ref 5–15)
BUN: 14 mg/dL (ref 8–23)
CO2: 22 mmol/L (ref 22–32)
Calcium: 8.5 mg/dL — ABNORMAL LOW (ref 8.9–10.3)
Chloride: 98 mmol/L (ref 98–111)
Creatinine, Ser: 1.11 mg/dL (ref 0.61–1.24)
GFR calc Af Amer: >60 mL/min (ref >60)
GFR calc non Af Amer: >60 mL/min (ref >60)
Glucose, Bld: 123 mg/dL — ABNORMAL HIGH (ref 70–99)
Potassium: 3.7 mmol/L (ref 3.5–5.1)
Sodium: 130 mmol/L — ABNORMAL LOW (ref 135–145)
Total Bilirubin: 1.2 mg/dL (ref 0.3–1.2)
Total Protein: 7.6 g/dL (ref 6.5–8.1)

## 2020-06-18 LAB — LACTIC ACID, PLASMA: Lactic Acid, Venous: 1.7 mmol/L (ref 0.5–1.9)

## 2020-06-18 MED ORDER — ACETAMINOPHEN 325 MG PO TABS
650.0000 mg | ORAL_TABLET | Freq: Once | ORAL | Status: AC | PRN
  Administered 2020-06-18: 650 mg via ORAL
  Filled 2020-06-18: qty 2

## 2020-06-18 NOTE — ED Triage Notes (Signed)
Per EMS, patient from home, c/o frequent urination x1 months. Takes a diuretic daily. A&Ox4. Denies pain.

## 2020-06-18 NOTE — ED Notes (Signed)
This Clinical research associate attempted labs. Unsuccessful x2

## 2020-06-19 ENCOUNTER — Emergency Department (HOSPITAL_COMMUNITY): Payer: Medicare HMO

## 2020-06-19 ENCOUNTER — Other Ambulatory Visit: Payer: Self-pay

## 2020-06-19 ENCOUNTER — Inpatient Hospital Stay (HOSPITAL_COMMUNITY)
Admission: EM | Admit: 2020-06-19 | Discharge: 2020-06-30 | DRG: 871 | Disposition: A | Discharge status: Home Health | Payer: Medicare HMO | Attending: Internal Medicine | Admitting: Internal Medicine

## 2020-06-19 ENCOUNTER — Observation Stay (HOSPITAL_COMMUNITY): Payer: Medicare HMO

## 2020-06-19 DIAGNOSIS — R42 Dizziness and giddiness: Secondary | ICD-10-CM

## 2020-06-19 DIAGNOSIS — F1010 Alcohol abuse, uncomplicated: Secondary | ICD-10-CM

## 2020-06-19 DIAGNOSIS — R32 Unspecified urinary incontinence: Secondary | ICD-10-CM

## 2020-06-19 DIAGNOSIS — R6510 Systemic inflammatory response syndrome (SIRS) of non-infectious origin without acute organ dysfunction: Secondary | ICD-10-CM | POA: Diagnosis not present

## 2020-06-19 DIAGNOSIS — E785 Hyperlipidemia, unspecified: Secondary | ICD-10-CM | POA: Diagnosis present

## 2020-06-19 DIAGNOSIS — J189 Pneumonia, unspecified organism: Secondary | ICD-10-CM

## 2020-06-19 DIAGNOSIS — R55 Syncope and collapse: Secondary | ICD-10-CM

## 2020-06-19 DIAGNOSIS — R159 Full incontinence of feces: Secondary | ICD-10-CM

## 2020-06-19 DIAGNOSIS — I10 Essential (primary) hypertension: Secondary | ICD-10-CM | POA: Diagnosis present

## 2020-06-19 DIAGNOSIS — R3915 Urgency of urination: Secondary | ICD-10-CM

## 2020-06-19 DIAGNOSIS — A419 Sepsis, unspecified organism: Secondary | ICD-10-CM | POA: Diagnosis present

## 2020-06-19 LAB — COMPREHENSIVE METABOLIC PANEL
ALT: 29 U/L (ref 0–44)
AST: 67 U/L — ABNORMAL HIGH (ref 15–41)
Albumin: 3.1 g/dL — ABNORMAL LOW (ref 3.5–5.0)
Alkaline Phosphatase: 67 U/L (ref 38–126)
Anion gap: 12 (ref 5–15)
BUN: 15 mg/dL (ref 8–23)
CO2: 26 mmol/L (ref 22–32)
Calcium: 8.9 mg/dL (ref 8.9–10.3)
Chloride: 97 mmol/L — ABNORMAL LOW (ref 98–111)
Creatinine, Ser: 1.16 mg/dL (ref 0.61–1.24)
GFR calc Af Amer: >60 mL/min (ref >60)
GFR calc non Af Amer: >60 mL/min (ref >60)
Glucose, Bld: 89 mg/dL (ref 70–99)
Potassium: 3.8 mmol/L (ref 3.5–5.1)
Sodium: 135 mmol/L (ref 135–145)
Total Bilirubin: 1.3 mg/dL — ABNORMAL HIGH (ref 0.3–1.2)
Total Protein: 7.9 g/dL (ref 6.5–8.1)

## 2020-06-19 LAB — RAPID URINE DRUG SCREEN, HOSP PERFORMED
Amphetamines: NOT DETECTED
Barbiturates: NOT DETECTED
Benzodiazepines: NOT DETECTED
Cocaine: NOT DETECTED
Opiates: NOT DETECTED
Tetrahydrocannabinol: NOT DETECTED

## 2020-06-19 LAB — PHOSPHORUS: Phosphorus: 2.5 mg/dL (ref 2.5–4.6)

## 2020-06-19 LAB — URINALYSIS, ROUTINE W REFLEX MICROSCOPIC
Bilirubin Urine: NEGATIVE
Glucose, UA: NEGATIVE mg/dL
Ketones, ur: 5 mg/dL — AB
Leukocytes,Ua: NEGATIVE
Nitrite: NEGATIVE
Protein, ur: >=300 mg/dL — AB
Specific Gravity, Urine: 1.016 (ref 1.005–1.030)
pH: 5 (ref 5.0–8.0)

## 2020-06-19 LAB — PROTIME-INR
INR: 1 (ref 0.8–1.2)
Prothrombin Time: 12.8 seconds (ref 11.4–15.2)

## 2020-06-19 LAB — AMMONIA: Ammonia: 13 umol/L (ref 9–35)

## 2020-06-19 LAB — RESPIRATORY PANEL BY RT PCR (FLU A&B, COVID)
Influenza A by PCR: NEGATIVE
Influenza B by PCR: NEGATIVE
SARS Coronavirus 2 by RT PCR: NEGATIVE

## 2020-06-19 LAB — CBC
HCT: 42.3 % (ref 39.0–52.0)
Hemoglobin: 13.9 g/dL (ref 13.0–17.0)
MCH: 28.4 pg (ref 26.0–34.0)
MCHC: 32.9 g/dL (ref 30.0–36.0)
MCV: 86.3 fL (ref 80.0–100.0)
Platelets: 219 10*3/uL (ref 150–400)
RBC: 4.9 MIL/uL (ref 4.22–5.81)
RDW: 14.9 % (ref 11.5–15.5)
WBC: 10.7 10*3/uL — ABNORMAL HIGH (ref 4.0–10.5)
nRBC: 0 % (ref 0.0–0.2)

## 2020-06-19 LAB — LIPID PANEL
Cholesterol: 116 mg/dL (ref 0–200)
HDL: 24 mg/dL — ABNORMAL LOW (ref >40)
LDL Cholesterol: 79 mg/dL (ref 0–99)
Total CHOL/HDL Ratio: 4.8 RATIO
Triglycerides: 67 mg/dL (ref <150)
VLDL: 13 mg/dL (ref 0–40)

## 2020-06-19 LAB — HEMOGLOBIN A1C
Hgb A1c MFr Bld: 5.9 % — ABNORMAL HIGH (ref 4.8–5.6)
Mean Plasma Glucose: 122.63 mg/dL

## 2020-06-19 LAB — MAGNESIUM: Magnesium: 2.4 mg/dL (ref 1.7–2.4)

## 2020-06-19 LAB — ETHANOL: Alcohol, Ethyl (B): <10 mg/dL (ref <10)

## 2020-06-19 LAB — LACTIC ACID, PLASMA: Lactic Acid, Venous: 1.6 mmol/L (ref 0.5–1.9)

## 2020-06-19 LAB — TSH: TSH: 0.765 u[IU]/mL (ref 0.350–4.500)

## 2020-06-19 MED ORDER — SODIUM CHLORIDE 0.9 % IV SOLN
500.0000 mg | Freq: Once | INTRAVENOUS | Status: AC
  Administered 2020-06-19: 500 mg via INTRAVENOUS
  Filled 2020-06-19: qty 500

## 2020-06-19 MED ORDER — ADULT MULTIVITAMIN W/MINERALS CH
1.0000 | ORAL_TABLET | Freq: Every day | ORAL | Status: DC
  Administered 2020-06-19 – 2020-06-30 (×12): 1 via ORAL
  Filled 2020-06-19 (×12): qty 1

## 2020-06-19 MED ORDER — ENOXAPARIN SODIUM 40 MG/0.4ML ~~LOC~~ SOLN
40.0000 mg | SUBCUTANEOUS | Status: DC
  Administered 2020-06-19 – 2020-06-30 (×12): 40 mg via SUBCUTANEOUS
  Filled 2020-06-19 (×12): qty 0.4

## 2020-06-19 MED ORDER — LORAZEPAM 2 MG/ML IJ SOLN
1.0000 mg | INTRAMUSCULAR | Status: AC | PRN
  Administered 2020-06-19: 2 mg via INTRAVENOUS
  Filled 2020-06-19: qty 1

## 2020-06-19 MED ORDER — ROSUVASTATIN CALCIUM 20 MG PO TABS
40.0000 mg | ORAL_TABLET | Freq: Every day | ORAL | Status: DC
  Administered 2020-06-19 – 2020-06-30 (×12): 40 mg via ORAL
  Filled 2020-06-19 (×12): qty 2

## 2020-06-19 MED ORDER — SODIUM CHLORIDE 0.9 % IV SOLN
500.0000 mg | INTRAVENOUS | Status: DC
  Administered 2020-06-20 – 2020-06-21 (×2): 500 mg via INTRAVENOUS
  Filled 2020-06-19 (×2): qty 500

## 2020-06-19 MED ORDER — ACETAMINOPHEN 650 MG RE SUPP
650.0000 mg | Freq: Four times a day (QID) | RECTAL | Status: DC | PRN
  Administered 2020-06-19: 650 mg via RECTAL
  Filled 2020-06-19: qty 1

## 2020-06-19 MED ORDER — SODIUM CHLORIDE 0.9 % IV BOLUS
1000.0000 mL | Freq: Once | INTRAVENOUS | Status: AC
  Administered 2020-06-19: 1000 mL via INTRAVENOUS

## 2020-06-19 MED ORDER — SODIUM CHLORIDE 0.9% FLUSH
3.0000 mL | Freq: Two times a day (BID) | INTRAVENOUS | Status: DC
  Administered 2020-06-19 – 2020-06-30 (×16): 3 mL via INTRAVENOUS

## 2020-06-19 MED ORDER — LORAZEPAM 1 MG PO TABS: 1.0000 mg | ORAL_TABLET | ORAL | Status: AC | PRN

## 2020-06-19 MED ORDER — AMLODIPINE BESYLATE 5 MG PO TABS
5.0000 mg | ORAL_TABLET | Freq: Every day | ORAL | Status: DC
  Administered 2020-06-19: 5 mg via ORAL
  Filled 2020-06-19: qty 1

## 2020-06-19 MED ORDER — ACETAMINOPHEN 325 MG PO TABS
325.0000 mg | ORAL_TABLET | Freq: Once | ORAL | Status: AC
  Administered 2020-06-19: 325 mg via ORAL
  Filled 2020-06-19: qty 1

## 2020-06-19 MED ORDER — LACTATED RINGERS IV BOLUS (SEPSIS)
1000.0000 mL | Freq: Once | INTRAVENOUS | Status: AC
  Administered 2020-06-19: 1000 mL via INTRAVENOUS

## 2020-06-19 MED ORDER — ACETAMINOPHEN 325 MG PO TABS
650.0000 mg | ORAL_TABLET | Freq: Four times a day (QID) | ORAL | Status: DC | PRN
  Administered 2020-06-20: 650 mg via ORAL
  Filled 2020-06-19 (×2): qty 2

## 2020-06-19 MED ORDER — METOPROLOL TARTRATE 5 MG/5ML IV SOLN: 5.0000 mg | Freq: Four times a day (QID) | INTRAVENOUS | Status: DC | PRN

## 2020-06-19 MED ORDER — SODIUM CHLORIDE 0.9 % IV SOLN
1.0000 g | Freq: Once | INTRAVENOUS | Status: DC
  Filled 2020-06-19: qty 10

## 2020-06-19 MED ORDER — THIAMINE HCL 100 MG PO TABS
100.0000 mg | ORAL_TABLET | Freq: Every day | ORAL | Status: DC
  Administered 2020-06-19 – 2020-06-30 (×12): 100 mg via ORAL
  Filled 2020-06-19 (×12): qty 1

## 2020-06-19 MED ORDER — SODIUM CHLORIDE 0.9 % IV SOLN
2.0000 g | INTRAVENOUS | Status: DC
  Administered 2020-06-20 – 2020-06-22 (×3): 2 g via INTRAVENOUS
  Filled 2020-06-19 (×3): qty 2

## 2020-06-19 MED ORDER — SODIUM CHLORIDE 0.9 % IV BOLUS: 1000.0000 mL | Freq: Once | INTRAVENOUS | Status: DC

## 2020-06-19 MED ORDER — ASPIRIN EC 81 MG PO TBEC
81.0000 mg | DELAYED_RELEASE_TABLET | Freq: Every day | ORAL | Status: DC
  Administered 2020-06-19 – 2020-06-30 (×12): 81 mg via ORAL
  Filled 2020-06-19 (×13): qty 1

## 2020-06-19 MED ORDER — THIAMINE HCL 100 MG/ML IJ SOLN: 100.0000 mg | Freq: Every day | INTRAMUSCULAR | Status: DC

## 2020-06-19 MED ORDER — FOLIC ACID 1 MG PO TABS
1.0000 mg | ORAL_TABLET | Freq: Every day | ORAL | Status: DC
  Administered 2020-06-19 – 2020-06-30 (×12): 1 mg via ORAL
  Filled 2020-06-19 (×12): qty 1

## 2020-06-19 MED ORDER — SODIUM CHLORIDE 0.9 % IV SOLN
1.0000 g | Freq: Once | INTRAVENOUS | Status: AC
  Administered 2020-06-19: 1 g via INTRAVENOUS

## 2020-06-19 NOTE — H&P (Signed)
History and Physical        Hospital Admission Note Date: 06/19/2020  Patient name: Jeremiah Garcia Medical record number: [id number] Date of birth: 03-08-1946 Age: 74 y.o. Gender: male  PCP: Jackie Plum, MD  Patient coming from: unknown, possibly homeless vs. Staying with a friend  Chief Complaint    Chief Complaint  Patient presents with  . Urinary Frequency  . Fever      HPI:   Patient is a poor historian and much of the history is obtained from prior notes as well as his granddaughter, Reva Bores, over the phone  This is a 74 year old male with past medical history of CVA with residual LUE weakness, hypertension, hyperlipidemia, alcohol abuse, tobacco use who presented to the ED for evaluation of presyncopal episode yesterday.  According to the patient, he was concerned that he may have a UTI as he has been urinating on himself frequently over the past month and more so over the last several days and denies any dysuria or known fevers.  According to the ED provider note, patient was sitting outside under a tent with his friends when he got up and tried to switch chairs when he felt lightheaded and almost fell down to the ground however his friend caught him and never hit the ground.  Felt lightheaded when he stood up from sitting. He denies any known fever, cough, sick contacts. He reports that his last drink was two days ago and drinks alcohol daily.   According to his granddaughter, the patient was staying with her in [place], [place] and then moved back to [place] recently. She mentioned he is an alcoholic, and she noticed he was shaking a lot and was getting lost frequently. There was some concern for dementia and she mentioned that he also may have a history of renal stones.   ED Course: ED vitals: T 103.70F, HR 102, BP 161/81, 97% on room air.  Notable labs: Sodium  130, glucose 123, AST 48, ALT 23, lactic acid 1.7, WBC 12.7.  COVID-19 and flu negative.  UA cloudy with large Hb, 5 ketones, negative nitrites, negative leukocytes, protein> 300, rare bacteria, WBCs 11-20.  CXR with patchy retrocardiac opacity which could be due to atelectasis and/or infectious etiology.  He was given ceftriaxone and azithromycin in the ED for CAP coverage.  Vitals:   06/19/20 1010 06/19/20 1136  BP: (!) 144/69 133/65  Pulse: 93 92  Resp: 20 20  Temp:    SpO2: 98% 97%     Review of Systems:  Review of Systems  Constitutional: Negative for chills and fever.  Respiratory: Negative for cough and shortness of breath.   Cardiovascular: Negative for chest pain and palpitations.  Gastrointestinal: Negative for nausea and vomiting.  Genitourinary: Positive for frequency and hematuria. Negative for dysuria.  Musculoskeletal: Negative for myalgias.  Neurological: Negative for weakness and headaches.  All other systems reviewed and are negative.   Medical/Social/Family History   Past Medical History: Past Medical History:  Diagnosis Date  . Carotid artery occlusion March 02, 2010   RIGHT  cea  . CVA (cerebral infarction) 2011   with Left upper extremity weakness  . Hyperlipidemia   . Hypertension   .  Stroke [facility]) June 2011   Left side weakness    Past Surgical History:  Procedure Laterality Date  . CAROTID ENDARTERECTOMY  March 02, 2010   RIGHT cea  . HERNIA REPAIR  2009    Medications: Prior to Admission medications   Medication Sig Start Date End Date Taking? Authorizing Provider  amLODipine (NORVASC) 5 MG tablet Take 1 tablet (5 mg total) by mouth daily. 11/01/12   Lars Mage, MD  aspirin EC 81 MG tablet Take 81 mg by mouth daily.    [provider]  fish oil-omega-3 fatty acids 1000 MG capsule Take 1 g by mouth 2 (two) times daily.     [provider]  lisinopril (PRINIVIL,ZESTRIL) 40 MG tablet Take 1 tablet (40 mg total) by mouth daily.  11/01/12   Lars Mage, MD  rosuvastatin (CRESTOR) 40 MG tablet Take 1 tablet (40 mg total) by mouth daily. 11/01/12   Lars Mage, MD  tadalafil (CIALIS) 20 MG tablet Take 1/2 tab 30 minutes prior to anticipated sexual activity for erections. 11/01/12   Lars Mage, MD    Allergies:  No Known Allergies  Social History:  reports that he has been smoking cigarettes. He has a 6.00 pack-year smoking history. He has never used smokeless tobacco. He reports current alcohol use of about 16.0 standard drinks of alcohol per week. He reports that he does not use drugs.  Family History: Family History  Problem Relation Age of Onset  . Hypertension Mother   . Heart disease Father        Before age 55  . Heart attack Father   . Diabetes Brother      Objective   Physical Exam: Blood pressure 133/65, pulse 92, temperature 98.2 F (36.8 C), temperature source Oral, resp. rate 20, height 6\' 2"  (1.88 m), weight 86.2 kg, SpO2 97 %.  Physical Exam Vitals and nursing note reviewed. Exam conducted with a chaperone present.  Constitutional:      General: He is not in acute distress.    Comments: Masked facies  HENT:     Head: Normocephalic and atraumatic.     Mouth/Throat:     Mouth: Mucous membranes are moist.  Eyes:     Conjunctiva/sclera: Conjunctivae normal.  Cardiovascular:     Rate and Rhythm: Normal rate and regular rhythm.  Pulmonary:     Effort: Pulmonary effort is normal.     Breath sounds: Normal breath sounds.  Abdominal:     General: Abdomen is flat.     Palpations: There is hepatomegaly.  Musculoskeletal:        General: No swelling or tenderness.     Comments: Negative CVA tenderness  Skin:    Coloration: Skin is not jaundiced or pale.  Neurological:     Mental Status: He is alert. He is disoriented.     Comments: Oriented x 2, did not know the year (said 7)  Psychiatric:        Mood and Affect: Mood normal.        Behavior: Behavior normal.     LABS on  Admission: I have personally reviewed all the labs and imaging below    Basic Metabolic Panel: Recent Labs  Lab 06/18/20 2041  NA 130*  K 3.7  CL 98  CO2 22  GLUCOSE 123*  BUN 14  CREATININE 1.11  CALCIUM 8.5*   Liver Function Tests: Recent Labs  Lab 06/18/20 2041  AST 48*  ALT 23  ALKPHOS 68  BILITOT  1.2  PROT 7.6  ALBUMIN 3.2*   No results for input(s): LIPASE, AMYLASE in the last 168 hours. No results for input(s): AMMONIA in the last 168 hours. CBC: Recent Labs  Lab 06/18/20 2041  WBC 12.7*  NEUTROABS 10.7*  HGB 13.9  HCT 42.2  MCV 85.9  PLT 211   Cardiac Enzymes: No results for input(s): CKTOTAL, CKMB, CKMBINDEX, TROPONINI in the last 168 hours. BNP: Invalid input(s): POCBNP CBG: No results for input(s): GLUCAP in the last 168 hours.  Radiological Exams on Admission:  DG Chest 2 View  Result Date: 06/19/2020 CLINICAL DATA:  Suspected sepsis EXAM: CHEST - 2 VIEW COMPARISON:  October 12, 2012 FINDINGS: The heart size and mediastinal contours are within normal limits. Patchy airspace opacity seen within the retrocardiac region. The right lung is clear. No pleural effusion is seen. The visualized skeletal structures are unremarkable. IMPRESSION: Patchy retrocardiac opacity which could be due to atelectasis and/or infectious etiology. Electronically Signed   By: Jonna Clark M.D.   On: 06/19/2020 03:31      EKG: Independently reviewed.    A & P   Principal Problem:   SIRS (systemic inflammatory response syndrome) (HCC) Active Problems:   Hyperlipidemia   Essential hypertension, benign   Postural dizziness with presyncope   Alcohol abuse   1. SIRS concern for developing sepsis with unknown source, CAP vs. UTI/Pyelonephritis a. SIRS criteria: Febrile, tachycardic with leukocytosis. No lactic acidosis b. DDx: CAP (Possible retrocardiac opacity on CXR) vs UTI/pyelonephritis (pyuria on UA. ED provider noted CVA tenderness, not noted on my  exam) c. Check urine and blood cultures d. Continue Ceftriaxone and Azithromycin for now e. IV fluids f. CT abd/pelvis  2. Presyncope a. Likely orthostatic b. Tele c. Orthostatic vital signs d. IV fluids e. PT eval  3. Alcohol abuse, concern for withdrawal a. Last drink 2-3 days ago, occasionally tremulous b. Ethanol level pending c. Hepatomegaly on exam d. Check ammonia level e. CIWA protocol  4. Urinary Urgency/Frequency a. Hematuria and proteinuria on UA b. Follow up urine studies as above c. Check HbA1c d. Bladder scan  5. Hypertension a. Continue home amlodipine b. Holding home Lisinopril for now  6. History of CVA a. Continue home aspirin and statin b. Check HbA1c c. Check Lipid panel  7. Suspected underlying dementia +/- parkinsons  a. Masked facies, questionable tremors (though this is more likely from alcohol withdrawal),  b. Needs to establish with a PCP if he does not already have one   DVT prophylaxis: Lovenox   Code Status: Not on file  Diet: Heart Healthy Family Communication: Admission, patients condition and plan of care including tests being ordered have been discussed with the patient who indicates understanding and agrees with the plan and Code Status. Patient's granddaughter was updated  Disposition Plan: The appropriate patient status for this patient is OBSERVATION. Observation status is judged to be reasonable and necessary in order to provide the required intensity of service to ensure the patient's safety. The patient's presenting symptoms, physical exam findings, and initial radiographic and laboratory data in the context of their medical condition is felt to place them at decreased risk for further clinical deterioration. Furthermore, it is anticipated that the patient will be medically stable for discharge from the hospital within 2 midnights of admission. The following factors support the patient status of observation.   " The patient's  presenting symptoms include presyncope, urinary urgency. " The physical exam findings include masked facies. " The initial radiographic  and laboratory data are concerning for SIRS vs. Sepsis.     Consultants  . none  Procedures  . none  Time Spent on Admission: 65 minutes    Jae DireJared E Breon Rehm, DO [facility]  06/19/2020, 11:51 AM

## 2020-06-19 NOTE — Progress Notes (Signed)
Patient arrived to floor with rectal temp of 103.5 F. Dr. Dairl Ponder notified.  Tylenol given and first bag of LR bolus administering now.  Granddaughter at bedside.

## 2020-06-19 NOTE — ED Notes (Signed)
Pt. Ambulated in the room. Pt  Beginning O2 was @ 98% while walking around in the room. Pt. O2  Decreased to 92% while moving around. Pt. Denied SOB and Dizziness. Nurse aware.

## 2020-06-19 NOTE — Evaluation (Signed)
Physical Therapy Evaluation Patient Details Name: Jeremiah Garcia MRN: [id number] DOB: 1946-01-16 Today's Date: 06/19/2020   History of Present Illness  Pt admitted with fever and urinary frequency and now with dx of SIRS vs sepsis.  Pt with hx of CVA, ETOH abuse and tremor  Clinical Impression  Pt admitted as above and presenting with functional mobility limitations 2* generalized weakness, ambulatory balance deficits and limited endurance.  Pt hopes to progress to regain IND but unclear on dc destination - ?homeless.     Follow Up Recommendations Other (comment) (TBD based on acute stay progress )    Equipment Recommendations  None recommended by PT;Other (comment) (TBD)    Recommendations for Other Services       Precautions / Restrictions Precautions Precautions: Fall Restrictions Weight Bearing Restrictions: No      Mobility  Bed Mobility Overal bed mobility: Needs Assistance Bed Mobility: Supine to Sit;Sit to Supine     Supine to sit: Min assist Sit to supine: Min assist   General bed mobility comments: Increased time with assist to bring trunk to upright and to bring LEs back up onto bed  Transfers Overall transfer level: Needs assistance Equipment used: Rolling walker (2 wheeled) Transfers: Sit to/from Stand Sit to Stand: Min assist;From elevated surface         General transfer comment: cues for use of UEs to self assist and physical assist to bring wt up and fwd and to balance in intiail standing  Ambulation/Gait Ambulation/Gait assistance: Min assist Gait Distance (Feet): 32 Feet Assistive device: Rolling walker (2 wheeled) Gait Pattern/deviations: Step-to pattern;Step-through pattern;Decreased step length - right;Decreased step length - left;Shuffle;Trunk flexed Gait velocity: decr   General Gait Details: cues for posture and position from RW; short shuffling step; distance ltd by fatigue  Stairs            Wheelchair Mobility    Modified  Rankin (Stroke Patients Only)       Balance Overall balance assessment: Needs assistance Sitting-balance support: Feet supported;No upper extremity supported Sitting balance-Leahy Scale: Good     Standing balance support: No upper extremity supported Standing balance-Leahy Scale: Fair                               Pertinent Vitals/Pain Pain Assessment: No/denies pain    Home Living Family/patient expects to be discharged to:: Unsure                 Additional Comments: Unclear if pt is homeless or renting space is a friend's apartment.    Prior Function Level of Independence: Independent         Comments: Pt states IND with ambulation and all ADL     Hand Dominance        Extremity/Trunk Assessment   Upper Extremity Assessment Upper Extremity Assessment: Generalized weakness    Lower Extremity Assessment Lower Extremity Assessment: Generalized weakness       Communication   Communication: No difficulties  Cognition Arousal/Alertness: Awake/alert Behavior During Therapy: WFL for tasks assessed/performed Overall Cognitive Status: Within Functional Limits for tasks assessed                                 General Comments: Pt oriented x 4 and following all commands but noted to be incontinent of urine with incontinenct pad and bed linens saturated and seemed unaware  General Comments      Exercises     Assessment/Plan    PT Assessment Patient needs continued PT services  PT Problem List Decreased strength;Decreased activity tolerance;Decreased balance;Decreased mobility;Decreased knowledge of use of DME       PT Treatment Interventions DME instruction;Gait training;Stair training;Functional mobility training;Therapeutic activities;Balance training;Therapeutic exercise;Patient/family education    PT Goals (Current goals can be found in the Care Plan section)  Acute Rehab PT Goals Patient Stated Goal: Regain  IND PT Goal Formulation: With patient Time For Goal Achievement: 07/03/20 Potential to Achieve Goals: Fair    Frequency Min 3X/week   Barriers to discharge Other (comment) unclear of current living arrangement - homeless vs renting room in someone's apt?    Co-evaluation               AM-PAC PT "6 Clicks" Mobility  Outcome Measure Help needed turning from your back to your side while in a flat bed without using bedrails?: None Help needed moving from lying on your back to sitting on the side of a flat bed without using bedrails?: A Little Help needed moving to and from a bed to a chair (including a wheelchair)?: A Little Help needed standing up from a chair using your arms (e.g., wheelchair or bedside chair)?: A Little Help needed to walk in hospital room?: A Little Help needed climbing 3-5 steps with a railing? : A Lot 6 Click Score: 18    End of Session Equipment Utilized During Treatment: Gait belt Activity Tolerance: Patient tolerated treatment well;Patient limited by fatigue Patient left: in bed;with call bell/phone within reach Nurse Communication: Mobility status PT Visit Diagnosis: Difficulty in walking, not elsewhere classified (R26.2);Muscle weakness (generalized) (M62.81)    Time: 1572-6203 PT Time Calculation (min) (ACUTE ONLY): 21 min   Charges:   PT Evaluation $PT Eval Low Complexity: 1 Low          Jeremiah Garcia PT Acute Rehabilitation Services Pager [phone] Office [phone]   Jeremiah Garcia 06/19/2020, 3:13 PM

## 2020-06-19 NOTE — TOC Initial Note (Addendum)
Transition of Care [facility]) - Initial/Assessment Note    Patient Details  Name: Jeremiah Garcia MRN: [id number] Date of Birth: [birth year]  [facility] CM/SW Contact:    Lockie Pares, RN Phone Number: 06/19/2020, 8:41 AM  Clinical Narrative:                 Consulted to provide patient with PCP option as well as resources for independent/assisted living.  As patient has multiple problem, internal medicine was a good option for patient. Listed internal medicine at [facility] for the patient to call Monday to set up a appointment.  Patient currently paying rent to friend in a apartment, Requests assistance with assisted/independent living. Consulted [facility] that provides comprehensive services for both. They will help the patient navigate through and find the best option with his income. Placed their number for him to call in patient instructions as well.  PA and RN notified of this intervention.         Patient Goals and CMS Choice        Expected Discharge Plan and Services                                                Prior Living Arrangements/Services                       Activities of Daily Living      Permission Sought/Granted                  Emotional Assessment              Admission diagnosis:  frequent urination Patient Active Problem List   Diagnosis Date Noted  . 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 EMPHYSEMA 12/09/2010  . ACUTE CYSTITIS 11/16/2010  . COUGH 11/16/2010  . DENTAL CARIES 08/22/2010  . HYPERLIPIDEMIA 05/17/2010  . GLUCOSE INTOLERANCE 05/16/2010  . ESSENTIAL HYPERTENSION, BENIGN 05/16/2010  . TOBACCO ABUSE 03/14/2010  . CAROTID ARTERY STENOSIS, 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   PCP:  Jackie Plum, MD Pharmacy:   [facility] [address] [place]), [place] - [address] [place] ([place]) [place] [address] Phone: [phone] Fax:  [phone]     Social Determinants of Health (SDOH) Interventions    Readmission Risk Interventions No flowsheet data found.

## 2020-06-19 NOTE — ED Notes (Signed)
PT DINNER TRAY GIVEN

## 2020-06-19 NOTE — ED Provider Notes (Signed)
[place]-EMERGENCY DEPT Provider Note   CSN: [id number] Arrival date & time: 06/18/20  2030     History Chief Complaint  Patient presents with  . Urinary Frequency  . Fever    Jeremiah Garcia is a 74 y.o. male with history of hypertension, hyperlipidemia, stroke, tobacco use presents to the ER for evaluation of "they say I might have a UTI".  When asked what type of symptoms he has been experiencing he says his friends called 911 because he had a fall yesterday.  He was outside sitting under a tent with his friends when he got up to try and switch chairs he felt lightheaded and almost fell down to the ground.  States his friend actually caught him and he never hit the ground.  He felt light headed when he stood up from sitting.  Denies any associated chest pain, palpitations, shortness of breath at that time.  Patient has been waiting in the ER for over 10 hours to be seen.  States he no longer feels lightheaded with standing.  He does admit to increased urinary frequency but states this has been going on for months, a little worse last 4 to 5 days.  Triage documented patient takes diuretics daily but he denies.  He is able to tell me he takes daily aspirin, amlodipine, lisinopril and fish oil daily. Patient had a fever of 103.3 during triage but states he was not aware of having fevers.  No chills.  No nausea, vomiting.  Denies dysuria, lower abdominal or flank pain.  Denies changes in bowel movements.  Reports occasional dry cough but this is not new or worse and attributes it to smoking cigarettes.  Patient noted to have bilateral hand tremors during exam, he tells me this is not new.  Patient says that he is currently living with friends.  He pays them rent.  States his children have now moved back to [place] and may help him out with his living situation.  Has been trying to look for a one-bedroom senior apartment but states they are all backed up.  He has paid rent to  live with his friends this month. Complete two Pfizer vaccinations in May 2021.   HPI     Past Medical History:  Diagnosis Date  . Carotid artery occlusion March 02, 2010   RIGHT  cea  . CVA (cerebral infarction) 2011   with Left upper extremity weakness  . Hyperlipidemia   . Hypertension   . Stroke [facility](H[facility]) June 2011   Left side weakness    Patient Active Problem List   Diagnosis Date Noted  . 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 EMPHYSEMA 12/09/2010  . ACUTE CYSTITIS 11/16/2010  . COUGH 11/16/2010  . DENTAL CARIES 08/22/2010  . HYPERLIPIDEMIA 05/17/2010  . GLUCOSE INTOLERANCE 05/16/2010  . ESSENTIAL HYPERTENSION, BENIGN 05/16/2010  . TOBACCO ABUSE 03/14/2010  . CAROTID ARTERY STENOSIS, 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    Past Surgical History:  Procedure Laterality Date  . CAROTID ENDARTERECTOMY  March 02, 2010   RIGHT cea  . HERNIA REPAIR  2009       Family History  Problem Relation Age of Onset  . Hypertension Mother   . Heart disease Father        Before age 74  . Heart attack Father   . Diabetes Brother     Social History   Tobacco Use  . Smoking status: Current Every Day Smoker    Packs/day:  0.50    Years: 12.00    Pack years: 6.00    Types: Cigarettes  . Smokeless tobacco: Never Used  Substance Use Topics  . Alcohol use: Yes    Alcohol/week: 16.0 standard drinks    Types: 12 Glasses of wine, 4 Shots of liquor per week  . Drug use: No    Home Medications Prior to Admission medications   Medication Sig Start Date End Date Taking? Authorizing Provider  amLODipine (NORVASC) 5 MG tablet Take 1 tablet (5 mg total) by mouth daily. 11/01/12   Lars Mage, MD  aspirin EC 81 MG tablet Take 81 mg by mouth daily.    [provider]  fish oil-omega-3 fatty acids 1000 MG capsule Take 1 g by mouth 2 (two) times daily.     [provider]  lisinopril (PRINIVIL,ZESTRIL) 40 MG tablet Take 1 tablet (40 mg total) by mouth  daily. 11/01/12   Lars Mage, MD  rosuvastatin (CRESTOR) 40 MG tablet Take 1 tablet (40 mg total) by mouth daily. 11/01/12   Lars Mage, MD  tadalafil (CIALIS) 20 MG tablet Take 1/2 tab 30 minutes prior to anticipated sexual activity for erections. 11/01/12   Lars Mage, MD    Allergies    Patient has no known allergies.  Review of Systems   Review of Systems  Constitutional: Positive for fever.  Respiratory: Positive for cough (chronic).   Genitourinary: Positive for frequency.  Neurological: Positive for light-headedness (resolved).  All other systems reviewed and are negative.   Physical Exam Updated Vital Signs BP (!) 144/69 (BP Location: Right Arm)   Pulse 93   Temp 98.2 F (36.8 C) (Oral)   Resp 20   Ht  (1.88 m)   Wt 86.2 kg   SpO2 98%   BMI 24.39 kg/m   Physical Exam Vitals and nursing note reviewed.  Constitutional:      Appearance: He is well-developed.     Comments: Non toxic.  HENT:     Head: Normocephalic and atraumatic.     Nose: Nose normal.  Eyes:     Conjunctiva/sclera: Conjunctivae normal.  Cardiovascular:     Rate and Rhythm: Normal rate and regular rhythm.     Heart sounds: Normal heart sounds.  Pulmonary:     Effort: Pulmonary effort is normal.     Breath sounds: Normal breath sounds.  Abdominal:     General: Bowel sounds are normal.     Palpations: Abdomen is soft.     Tenderness: There is left CVA tenderness.     Comments: No G/R/R. No suprapubic tenderness. Negative Murphy's and McBurney's  Musculoskeletal:        General: Normal range of motion.     Cervical back: Normal range of motion.  Skin:    General: Skin is warm and dry.     Capillary Refill: Capillary refill takes less than 2 seconds.  Neurological:     Mental Status: He is alert.     Comments: Bilateral hand tremor with task, stops at rest.  Patient sitting at end of bed able to change into gown without help. Able to stand up and walk to door with steady gait. He denies  dizziness.   Psychiatric:        Behavior: Behavior normal.     ED Results / Procedures / Treatments   Labs (all labs ordered are listed, but only abnormal results are displayed) Labs Reviewed  COMPREHENSIVE METABOLIC PANEL - Abnormal; Notable for the following  components:      Result Value   Sodium 130 (*)    Glucose, Bld 123 (*)    Calcium 8.5 (*)    Albumin 3.2 (*)    AST 48 (*)    All other components within normal limits  CBC WITH DIFFERENTIAL/PLATELET - Abnormal; Notable for the following components:   WBC 12.7 (*)    Neutro Abs 10.7 (*)    Lymphs Abs 0.6 (*)    Monocytes Absolute 1.3 (*)    All other components within normal limits  URINALYSIS, ROUTINE W REFLEX MICROSCOPIC - Abnormal; Notable for the following components:   Color, Urine AMBER (*)    APPearance CLOUDY (*)    Hgb urine dipstick LARGE (*)    Ketones, ur 5 (*)    Protein, ur >=300 (*)    Bacteria, UA RARE (*)    Non Squamous Epithelial 0-5 (*)    All other components within normal limits  RESPIRATORY PANEL BY RT PCR (FLU A&B, COVID)  CULTURE, BLOOD (ROUTINE X 2)  CULTURE, BLOOD (ROUTINE X 2)  URINE CULTURE  LACTIC ACID, PLASMA  LACTIC ACID, PLASMA  PROTIME-INR  ETHANOL  RAPID URINE DRUG SCREEN, HOSP PERFORMED    EKG EKG Interpretation  Date/Time:  Saturday June 19 2020 10:06:12 EDT Ventricular Rate:  89 PR Interval:    QRS Duration: 100 QT Interval:  360 QTC Calculation: 438 R Axis:   142 Text Interpretation: Right and left arm electrode reversal, interpretation assumes no reversal Sinus rhythm Atrial premature complex Right axis deviation Abnormal T, consider ischemia, lateral leads similar to prior EKGs, other then probable  arm lead reversal Confirmed by Rolan Bucco [phone]) on 06/19/2020 10:36:02 AM   Radiology DG Chest 2 View  Result Date: 06/19/2020 CLINICAL DATA:  Suspected sepsis EXAM: CHEST - 2 VIEW COMPARISON:  October 12, 2012 FINDINGS: The heart size and mediastinal  contours are within normal limits. Patchy airspace opacity seen within the retrocardiac region. The right lung is clear. No pleural effusion is seen. The visualized skeletal structures are unremarkable. IMPRESSION: Patchy retrocardiac opacity which could be due to atelectasis and/or infectious etiology. Electronically Signed   By: Jonna Clark M.D.   On: 06/19/2020 03:31    Procedures .Critical Care Performed by: Liberty Handy, PA-C Authorized by: Liberty Handy, PA-C   Critical care provider statement:    Critical care time (minutes):  45   Critical care was necessary to treat or prevent imminent or life-threatening deterioration of the following conditions: SIRS.   Critical care was time spent personally by me on the following activities:  Discussions with consultants, evaluation of patient's response to treatment, examination of patient, ordering and performing treatments and interventions, ordering and review of laboratory studies, ordering and review of radiographic studies, pulse oximetry, re-evaluation of patient's condition, obtaining history from patient or surrogate, review of old charts and development of treatment plan with patient or surrogate   I assumed direction of critical care for this patient from another provider in my specialty: no     (including critical care time)  Medications Ordered in ED Medications  cefTRIAXone (ROCEPHIN) 1 g in sodium chloride 0.9 % 100 mL IVPB (has no administration in time range)  azithromycin (ZITHROMAX) 500 mg in sodium chloride 0.9 % 250 mL IVPB (has no administration in time range)  acetaminophen (TYLENOL) tablet 650 mg (650 mg Oral Given 06/18/20 2053)    ED Course  I have reviewed the triage vital signs and the  nursing notes.  Pertinent labs & imaging results that were available during my care of the patient were reviewed by me and considered in my medical decision making (see chart for details).  Clinical Course as of Jun 19 1036  Sat Jun 19, 2020  0743 Temp(!): 103.3 F (39.6 C) [CG]  0743 Pulse Rate(!): 102 [CG]  0743 WBC(!): 12.7 [CG]  0743 Temp: 98.2 F (36.8 C) [CG]  0743 Pulse Rate: 74 [CG]  0743 Lactic Acid, Venous: 1.6 [CG]  0743 Appearance(!): CLOUDY [CG]  0743 Protein(!): >=300 [CG]  0743 WBC, UA: 11-20 [CG]  0743 Bacteria, UA(!): RARE [CG]  0743 Squamous Epithelial / LPF: 0-5 [CG]  0743 WBC Clumps: PRESENT [CG]  0744 RBC / HPF: 0-5 [CG]  0744 Non Squamous Epithelial(!): 0-5 [CG]  0744 IMPRESSION: Patchy retrocardiac opacity which could be due to atelectasis and/or infectious etiology.    DG Chest 2 View [CG]  [phone] Spoke to Bridgette youngest daughter lives in [place]. Daughter states pt has early stage of dementia. Was admitted to hospital 2020 had kidney failure functioning at 4%. History HTN, HLD. Was in [place] with children and July pt just left and came to [facility], didn't tell family where he was going. Staying with a friend of his, pays her rent. Daughter states he is not well to live alone.  Has been roaming in the streets gets lost, doesn't know where he is. One time had to give the phone to strangers to help him find where he is. Daughter sent uber to pick him up. Daughter states he is an alcoholic, drinks "all day" but always denies it. Daughter Clarisse Gouge is POA, Information systems manager is Legal guardian    [CG]    Clinical Course User Index [CG] Jerrell Mylar   MDM Rules/Calculators/A&P                           74 y.o. yo M presents for light headedness and almost fall yesterday, possible UTI.   On exam, hand tremors noted. Left CVAT. Febrile 103.3 and mildly tachycardic 103 in triage, VS have normalized after tylenol.    He is a guarded and poor historian, does not really provide much information. Questionable current living situation.  Reports chronic cough and increase urinary frequency. No longer feeling light headed with standing.   I obtained additional history from triage, nursing  notes and review of medical chart.  Previous medical records available, nursing notes reviewed to obtain more history and assist with MDM  Chief complain involves an extensive number of treatment options and is a complaint that carries with it a high risk of complications and morbidity and mortality.    Differential diagnosis includes UTI/pyelonephritis vs occult PNA vs COVID. Fully vaccinated for COVID without URI symptoms.   Initial ER work up including laboratory studies and imaging ordered in triage by RN including CBC, BMP, UA, lactic acid, blood cultures, COVID given initial triage VS.  I ordered additional laboratory and imaging studies including EKG.    I have ordered continuous cardiac and pulse oximeter monitoring.   I have ordered ambulation with pulse oximeter.   Will plan for re-evaluation.  I have personally visualized and interpreted ER diagnostic work up including labs and imaging.    1022:  ER work up reveals mild leukocytosis 12.7. normal lactic acid.  UA not very convincing for infection - rare bacteria only however he reports urinary frequency, +L CVAT. Will send for  culture.  CXR with possible retrocardiac opacity vs atelectasis, patient denies URI symptoms, chronic cough in setting of cigarette use.  COVID/influenza is negative.  Initial EKG poor quality, noted several PACs, repeat with NSR.   I have re-evaluated the patient.   No clinical deterioration. Ambulated here with no symptoms, normal SpO2.    Obtained more information from daughter.  There is concern for daily alcohol use, early dementia, patient wondering the streets and getting lost. POA/legal guarding not comfortable with patient being discharged.   Patient meets SIRS criteria with fever 103.3, initial tachycardia 102, leukocytosis 12.7.  Source of infection possibly UTI vs CAP/aspiration, favoring the latter.    I have ordered IV antibiotics. Discussed with EDP regarding antibiotic choice will order  rocephin and azithromycin. History of tobacco use, ETOH abuse.   1032: Added ETOH, UDS.  Will admit for SIRS/sepsis, poor social situation. He has hand tremors but no other signs of severe alcohol withdrawal. Will benefit from CM while admitted. Daughter Clarisse Gouge flying from [place] tomorrow to see. Shared visit with EDP.   Final Clinical Impression(s) / ED Diagnoses Final diagnoses:  SIRS (systemic inflammatory response syndrome) [facility])    Rx / DC Orders ED Discharge Orders    None       Liberty Handy, PA-C 06/19/20 1038    Rolan Bucco, MD 06/19/20 1346

## 2020-06-20 DIAGNOSIS — R159 Full incontinence of feces: Secondary | ICD-10-CM | POA: Diagnosis present

## 2020-06-20 DIAGNOSIS — R809 Proteinuria, unspecified: Secondary | ICD-10-CM | POA: Diagnosis present

## 2020-06-20 DIAGNOSIS — B1920 Unspecified viral hepatitis C without hepatic coma: Secondary | ICD-10-CM | POA: Diagnosis present

## 2020-06-20 DIAGNOSIS — J189 Pneumonia, unspecified organism: Secondary | ICD-10-CM

## 2020-06-20 DIAGNOSIS — R3915 Urgency of urination: Secondary | ICD-10-CM

## 2020-06-20 DIAGNOSIS — R32 Unspecified urinary incontinence: Secondary | ICD-10-CM

## 2020-06-20 DIAGNOSIS — F17210 Nicotine dependence, cigarettes, uncomplicated: Secondary | ICD-10-CM | POA: Diagnosis present

## 2020-06-20 DIAGNOSIS — Z5900 Homelessness unspecified: Secondary | ICD-10-CM | POA: Diagnosis not present

## 2020-06-20 DIAGNOSIS — Z20822 Contact with and (suspected) exposure to covid-19: Secondary | ICD-10-CM | POA: Diagnosis present

## 2020-06-20 DIAGNOSIS — E785 Hyperlipidemia, unspecified: Secondary | ICD-10-CM | POA: Diagnosis present

## 2020-06-20 DIAGNOSIS — Z87442 Personal history of urinary calculi: Secondary | ICD-10-CM | POA: Diagnosis not present

## 2020-06-20 DIAGNOSIS — R6510 Systemic inflammatory response syndrome (SIRS) of non-infectious origin without acute organ dysfunction: Secondary | ICD-10-CM | POA: Diagnosis not present

## 2020-06-20 DIAGNOSIS — J9811 Atelectasis: Secondary | ICD-10-CM | POA: Diagnosis present

## 2020-06-20 DIAGNOSIS — F1010 Alcohol abuse, uncomplicated: Secondary | ICD-10-CM | POA: Diagnosis not present

## 2020-06-20 DIAGNOSIS — R42 Dizziness and giddiness: Secondary | ICD-10-CM | POA: Diagnosis not present

## 2020-06-20 DIAGNOSIS — Z8249 Family history of ischemic heart disease and other diseases of the circulatory system: Secondary | ICD-10-CM | POA: Diagnosis not present

## 2020-06-20 DIAGNOSIS — A419 Sepsis, unspecified organism: Secondary | ICD-10-CM | POA: Diagnosis present

## 2020-06-20 DIAGNOSIS — E860 Dehydration: Secondary | ICD-10-CM | POA: Diagnosis present

## 2020-06-20 DIAGNOSIS — F10239 Alcohol dependence with withdrawal, unspecified: Secondary | ICD-10-CM | POA: Diagnosis present

## 2020-06-20 DIAGNOSIS — I69354 Hemiplegia and hemiparesis following cerebral infarction affecting left non-dominant side: Secondary | ICD-10-CM | POA: Diagnosis not present

## 2020-06-20 DIAGNOSIS — Z833 Family history of diabetes mellitus: Secondary | ICD-10-CM | POA: Diagnosis not present

## 2020-06-20 DIAGNOSIS — F0390 Unspecified dementia without behavioral disturbance: Secondary | ICD-10-CM | POA: Diagnosis present

## 2020-06-20 DIAGNOSIS — Y900 Blood alcohol level of less than 20 mg/100 ml: Secondary | ICD-10-CM | POA: Diagnosis present

## 2020-06-20 DIAGNOSIS — R55 Syncope and collapse: Secondary | ICD-10-CM | POA: Diagnosis present

## 2020-06-20 DIAGNOSIS — I10 Essential (primary) hypertension: Secondary | ICD-10-CM | POA: Diagnosis not present

## 2020-06-20 DIAGNOSIS — R319 Hematuria, unspecified: Secondary | ICD-10-CM | POA: Diagnosis present

## 2020-06-20 DIAGNOSIS — E876 Hypokalemia: Secondary | ICD-10-CM | POA: Diagnosis present

## 2020-06-20 LAB — CBC
HCT: 43.7 % (ref 39.0–52.0)
Hemoglobin: 14.1 g/dL (ref 13.0–17.0)
MCH: 28.3 pg (ref 26.0–34.0)
MCHC: 32.3 g/dL (ref 30.0–36.0)
MCV: 87.8 fL (ref 80.0–100.0)
Platelets: 208 10*3/uL (ref 150–400)
RBC: 4.98 MIL/uL (ref 4.22–5.81)
RDW: 15.1 % (ref 11.5–15.5)
WBC: 9.5 10*3/uL (ref 4.0–10.5)
nRBC: 0 % (ref 0.0–0.2)

## 2020-06-20 LAB — COMPREHENSIVE METABOLIC PANEL
ALT: 27 U/L (ref 0–44)
AST: 69 U/L — ABNORMAL HIGH (ref 15–41)
Albumin: 2.4 g/dL — ABNORMAL LOW (ref 3.5–5.0)
Alkaline Phosphatase: 58 U/L (ref 38–126)
Anion gap: 10 (ref 5–15)
BUN: 14 mg/dL (ref 8–23)
CO2: 23 mmol/L (ref 22–32)
Calcium: 8.4 mg/dL — ABNORMAL LOW (ref 8.9–10.3)
Chloride: 103 mmol/L (ref 98–111)
Creatinine, Ser: 0.98 mg/dL (ref 0.61–1.24)
GFR calc Af Amer: >60 mL/min (ref >60)
GFR calc non Af Amer: >60 mL/min (ref >60)
Glucose, Bld: 97 mg/dL (ref 70–99)
Potassium: 3 mmol/L — ABNORMAL LOW (ref 3.5–5.1)
Sodium: 136 mmol/L (ref 135–145)
Total Bilirubin: 1.1 mg/dL (ref 0.3–1.2)
Total Protein: 6.4 g/dL — ABNORMAL LOW (ref 6.5–8.1)

## 2020-06-20 LAB — URINE CULTURE: Culture: <10000 — AB

## 2020-06-20 LAB — HIV ANTIBODY (ROUTINE TESTING W REFLEX): HIV Screen 4th Generation wRfx: NONREACTIVE

## 2020-06-20 LAB — PROCALCITONIN: Procalcitonin: 0.19 ng/mL

## 2020-06-20 MED ORDER — SODIUM CHLORIDE 0.9 % IV SOLN: INTRAVENOUS | Status: DC

## 2020-06-20 MED ORDER — POTASSIUM CHLORIDE CRYS ER 20 MEQ PO TBCR
40.0000 meq | EXTENDED_RELEASE_TABLET | ORAL | Status: AC
  Administered 2020-06-20 (×2): 40 meq via ORAL
  Filled 2020-06-20 (×2): qty 2

## 2020-06-20 MED ORDER — SODIUM CHLORIDE 0.9 % IV SOLN
25.0000 mg | Freq: Once | INTRAVENOUS | Status: AC
  Administered 2020-06-20: 25 mg via INTRAVENOUS
  Filled 2020-06-20: qty 1

## 2020-06-20 MED ORDER — OXYBUTYNIN CHLORIDE 5 MG PO TABS
5.0000 mg | ORAL_TABLET | Freq: Three times a day (TID) | ORAL | Status: DC
  Administered 2020-06-20 – 2020-06-23 (×10): 5 mg via ORAL
  Filled 2020-06-20 (×10): qty 1

## 2020-06-20 NOTE — Progress Notes (Addendum)
PROGRESS NOTE    Jeremiah Garcia  GDJ:[id number] DOB: 1946-01-11 DOA: 06/19/2020 PCP: Jeremiah Mccreedy, MD    Chief Complaint  Patient presents with  . Urinary Frequency  . Fever    Brief Narrative:  HPI per Dr. Neysa Bonito Patient is a poor historian and much of the history is obtained from prior notes as well as his granddaughter, Jeremiah Garcia, over the phone  This is a 74 year old male with past medical history of CVA with residual LUE weakness, hypertension, hyperlipidemia, alcohol abuse, tobacco use who presented to the ED for evaluation of presyncopal episode yesterday.  According to the patient, he was concerned that he may have a UTI as he has been urinating on himself frequently over the past month and more so over the last several days and denies any dysuria or known fevers.  According to the ED provider note, patient was sitting outside under a tent with his friends when he got up and tried to switch chairs when he felt lightheaded and almost fell down to the ground however his friend caught him and never hit the ground.  Felt lightheaded when he stood up from sitting. He denies any known fever, cough, sick contacts. He reports that his last drink was two days ago and drinks alcohol daily.   According to his granddaughter, the patient was staying with her in [place], [place] and then moved back to [place] recently. She mentioned he is an alcoholic, and she noticed he was shaking a lot and was getting lost frequently. There was some concern for dementia and she mentioned that he also may have a history of renal stones.   ED Course: ED vitals: T 103.33F, HR 102, BP 161/81, 97% on room air.  Notable labs: Sodium 130, glucose 123, AST 48, ALT 23, lactic acid 1.7, WBC 12.7.  COVID-19 and flu negative.  UA cloudy with large Hb, 5 ketones, negative nitrites, negative leukocytes, protein> 300, rare bacteria, WBCs 11-20.  CXR with patchy retrocardiac opacity which could be due to atelectasis  and/or infectious etiology.  He was given ceftriaxone and azithromycin in the ED for CAP coverage.   Assessment & Plan:   Principal Problem:   SIRS (systemic inflammatory response syndrome) (HCC) Active Problems:   Hyperlipidemia   Essential hypertension, benign   Postural dizziness with presyncope   Alcohol abuse  1 systemic inflammatory response concern for developing sepsis with likely source community-acquired pneumonia Patient admitted that met criteria for SIRS with fever, tachycardia, leukocytosis.  No lactic acidosis.  Chest x-ray done concerning for retrocardiac opacity concerning for pneumonia.  CT abdomen and pelvis with irregularly thick-walled bladder, underdistended.  Patchy left lower lobe opacity suspicious for pneumonia.  Mild right basilar opacity likely atelectasis.  Urinalysis with pyuria.  Urine cultures negative.  Blood cultures pending.  Continue Jeremiah Garcia IV Rocephin and azithromycin.  2.  Postural dizziness with presyncope Likely secondary to dehydration and volume depletion.  Check orthostatics.  Continue IV fluids.  Follow.  3.  Alcohol abuse, concern for withdrawal Patient noted to have had his last drink 2 to 3 days prior to admission.  Patient with some occasional tremulousness.  Alcohol level negative.  UDS negative.  Ammonia level at 13.  Check a thiamine level.  Continue the Ativan withdrawal protocol, thiamine, folic acid, multivitamin.  IV fluids.  Supportive care.  4.  Bowel and bladder incontinence Per family patient with bowel and bladder incontinence.  Patient may have some overflow incontinence.  CT abdomen and pelvis with  no significant abnormalities noted.  Will place on Ditropan.  Check MRI of the T and L-spine to rule out cord compression.  5.  Urinary urgency/frequency Patient with some hematuria and proteinuria on UA.  CT abdomen and pelvis negative for any ureteral stones.  Hemoglobin A1c at 5.9.  Place on Ditropan.  Follow.  6.   Hypertension Lisinopril on hold.  Concern for orthostasis.  Discontinue Norvasc.  Follow.  7.  Prior history of CVA Continue aspirin, statin.  Hemoglobin A1c 5.9.  PT/OT.  8.  Suspected underlying dementia plus or minus Parkinson's Patient with some questionable tremors, likely secondary to alcohol withdrawal.  Check a thiamine level, check a vitamin B12 level.  Will need to establish PCP.  9.  Poor oral intake Likely secondary to alcohol abuse.  Patient also states difficult for him to chew his food due to problems with his teeth and mouth pain.  Change diet to a soft diet.  SLP.  Follow.  10.  Homelessness Consult with TOC.   DVT prophylaxis: Lovenox Code Status: Full Family Communication: Updated granddaughter at bedside.  Updated daughter who was on the telephone. Disposition:   Status is: Inpatient    Dispo: The patient is from: Homeless              Anticipated d/c is to: Patient is homeless              Anticipated d/c date is: 3 to 4 days.              Patient currently on IV antibiotics for pneumonia, electrolyte abnormalities, work-up underway, appears to be homeless per granddaughter who was at bedside and per daughter on the telephone who lives in [place].       Consultants:   None  Procedures:   CT abdomen and pelvis 06/19/2020  Chest x-ray 06/19/2020  Antimicrobials:   IV Rocephin 06/19/2020  IV azithromycin 06/19/2020   Subjective: Sitting up in chair eating his lunch.  Patient is eating less than 25%.  Patient states difficulty chewing food due to his teeth.  Patient denies any chest pain.  No shortness of breath.  No occasional cough.  Denies any dysuria.  Granddaughter at bedside stating that was told patient has been having some bowel and urinary incontinence.  Per granddaughter patient homeless.  Patient denies any significant dizziness.  Patient states drinks about half a pint every 2 days of liquor with a little bit of  water.  Objective: Vitals:   06/20/20 0550 06/20/20 0842 06/20/20 0846 06/20/20 0849  BP: 115/65 130/70 114/69 99/70  Pulse: 69 77 86 97  Resp: 19     Temp: 99.4 F (37.4 C)     TempSrc:      SpO2: 99% 99%  96%  Weight:      Height:        Intake/Output Summary (Last 24 hours) at 06/20/2020 1227 Last data filed at 06/20/2020 0930 Gross per 24 hour  Intake 370 ml  Output 900 ml  Net -530 ml   Filed Weights   06/19/20 0209 06/19/20 0211  Weight: 86.2 kg 86.2 kg    Examination:  General exam: Appears calm and comfortable  Respiratory system: Clear to auscultation. Respiratory effort normal. Cardiovascular system: S1 & S2 heard, RRR. No JVD, murmurs, rubs, gallops or clicks. No pedal edema. Gastrointestinal system: Abdomen is nondistended, soft and nontender. No organomegaly or masses felt. Normal bowel sounds heard. Central nervous system: Alert and oriented. No focal  neurological deficits. Extremities: Symmetric 5 x 5 power. Skin: No rashes, lesions or ulcers Psychiatry: Judgement and insight appear fair. Mood & affect appropriate.     Data Reviewed: I have personally reviewed following labs and imaging studies  CBC: Recent Labs  Lab 06/18/20 2041 06/19/20 1644 06/20/20 0601  WBC 12.7* 10.7* 9.5  NEUTROABS 10.7*  --   --   HGB 13.9 13.9 14.1  HCT 42.2 42.3 43.7  MCV 85.9 86.3 87.8  PLT 211 219 245    Basic Metabolic Panel: Recent Labs  Lab 06/18/20 2041 06/19/20 1644 06/19/20 1650 06/20/20 0601  NA 130*  --  135 136  K 3.7  --  3.8 3.0*  CL 98  --  97* 103  CO2 22  --  26 23  GLUCOSE 123*  --  89 97  BUN 14  --  15 14  CREATININE 1.11  --  1.16 0.98  CALCIUM 8.5*  --  8.9 8.4*  MG  --  2.4  --   --   PHOS  --  2.5  --   --     GFR: Estimated Creatinine Clearance: 76.9 mL/min (by C-G formula based on SCr of 0.98 mg/dL).  Liver Function Tests: Recent Labs  Lab 06/18/20 2041 06/19/20 1650 06/20/20 0601  AST 48* 67* 69*  ALT $Re'23 29 27   'OFI$ ALKPHOS 68 67 58  BILITOT 1.2 1.3* 1.1  PROT 7.6 7.9 6.4*  ALBUMIN 3.2* 3.1* 2.4*    CBG: No results for input(s): GLUCAP in the last 168 hours.   Recent Results (from the past 240 hour(s))  Culture, blood (Routine x 2)     Status: None (Preliminary result)   Collection Time: 06/19/20  2:44 AM   Specimen: BLOOD  Result Value Ref Range Status   Specimen Description   Final    BLOOD RIGHT ARM Performed at [facility] [address], [place], [place] [address]    Special Requests   Final    BOTTLES DRAWN AEROBIC AND ANAEROBIC Blood Culture adequate volume Performed at [facility] [address] [place], [place] [address]    Culture   Final    NO GROWTH 1 DAY Performed at [facility], [place] [address] [place], [place] [address]    Report Status PENDING  Incomplete  Respiratory Panel by RT PCR (Flu A&B, Covid) - Nasopharyngeal Swab     Status: None   Collection Time: 06/19/20  5:29 AM   Specimen: Nasopharyngeal Swab  Result Value Ref Range Status   SARS Coronavirus 2 by RT PCR NEGATIVE NEGATIVE Final    Comment: (NOTE) SARS-CoV-2 target nucleic acids are NOT DETECTED.  The SARS-CoV-2 RNA is generally detectable in upper respiratoy specimens during the acute phase of infection. The lowest concentration of SARS-CoV-2 viral copies this assay can detect is 131 copies/mL. A negative result does not preclude SARS-Cov-2 infection and should not be used as the sole basis for treatment or other patient management decisions. A negative result may occur with  improper specimen collection/handling, submission of specimen other than nasopharyngeal swab, presence of viral mutation(s) within the areas targeted by this assay, and inadequate number of viral copies (<131 copies/mL). A negative result must be combined with clinical observations, patient history, and epidemiological information. The expected result is Negative.  Fact Sheet  for Patients:  PinkCheek.be  Fact Sheet for Healthcare Providers:  GravelBags.it  This test is no t yet approved or cleared by  the [employer] and  has been authorized for detection and/or diagnosis of SARS-CoV-2 by FDA under an Emergency Use Authorization (EUA). This EUA will remain  in effect (meaning this test can be used) for the duration of the COVID-19 declaration under Section 564(b)(1) of the Act, 21 U.S.C. section 360bbb-3(b)(1), unless the authorization is terminated or revoked sooner.     Influenza A by PCR NEGATIVE NEGATIVE Final   Influenza B by PCR NEGATIVE NEGATIVE Final    Comment: (NOTE) The Xpert Xpress SARS-CoV-2/FLU/RSV assay is intended as an aid in  the diagnosis of influenza from Nasopharyngeal swab specimens and  should not be used as a sole basis for treatment. Nasal washings and  aspirates are unacceptable for Xpert Xpress SARS-CoV-2/FLU/RSV  testing.  Fact Sheet for Patients: PinkCheek.be  Fact Sheet for Healthcare Providers: GravelBags.it  This test is not yet approved or cleared by the [place] FDA and  has been authorized for detection and/or diagnosis of SARS-CoV-2 by  FDA under an Emergency Use Authorization (EUA). This EUA will remain  in effect (meaning this test can be used) for the duration of the  Covid-19 declaration under Section 564(b)(1) of the Act, 21  U.S.C. section 360bbb-3(b)(1), unless the authorization is  terminated or revoked. Performed at [facility], [place] [address], [place], [place] [address]   Urine culture     Status: Abnormal   Collection Time: 06/19/20  8:05 AM   Specimen: Urine, Random  Result Value Ref Range Status   Specimen Description   Final    URINE, RANDOM Performed at [facility] [address] [place], [place] [address]    Special Requests    Final    NONE Performed at [facility], [place] [address] [place], [place] [address]    Culture (A)  Final    <10,000 COLONIES/mL INSIGNIFICANT GROWTH Performed at [facility] [address], [place], [place] [address]    Report Status 06/20/2020 FINAL  Final  Culture, blood (Routine x 2)     Status: None (Preliminary result)   Collection Time: 06/19/20 11:15 AM   Specimen: BLOOD  Result Value Ref Range Status   Specimen Description   Final    BLOOD LEFT ANTECUBITAL Performed at [facility] [address] [place], [place] [address]    Special Requests   Final    BOTTLES DRAWN AEROBIC AND ANAEROBIC Blood Culture adequate volume Performed at [facility] [address] [place], [place] [address]    Culture   Final    NO GROWTH < 24 HOURS Performed at [facility] [address], [place],  [address]    Report Status PENDING  Incomplete         Radiology Studies: CT ABDOMEN PELVIS WO CONTRAST  Result Date: 06/19/2020 CLINICAL DATA:  Flank pain, urinary frequency EXAM: CT ABDOMEN AND PELVIS WITHOUT CONTRAST TECHNIQUE: Multidetector CT imaging of the abdomen and pelvis was performed following the standard protocol without IV contrast. COMPARISON:  10/30/2013 FINDINGS: Lower chest: Patchy left lower lobe opacity, suspicious for pneumonia. Mild right basilar opacity, atelectasis versus pneumonia. Hepatobiliary: Unenhanced liver is unremarkable. Layering small gallstones. No gallbladder wall thickening or pericholecystic fluid. No intrahepatic or extrahepatic ductal dilatation. Pancreas: Within normal limits. Spleen: Within normal limits. Adrenals/Urinary Tract: Adrenal glands are within normal limits. Kidneys are within normal limits. No renal, ureteral, or bladder calculi. No hydronephrosis. Irregularly thick-walled bladder (series 2/image  66), although underdistended. Stomach/Bowel: Stomach is within  normal limits. No evidence of bowel obstruction. Appendix is not discretely visualized. Colonic diverticulosis, without evidence of diverticulitis. Vascular/Lymphatic: No evidence of abdominal aortic aneurysm. Atherosclerotic calcifications of the abdominal aorta and branch vessels. No suspicious abdominopelvic lymphadenopathy. Reproductive: Prostate is unremarkable. Other: No abdominopelvic ascites. Musculoskeletal: Mild degenerative changes of the visualized thoracolumbar spine, most prominent at L5-S1. IMPRESSION: Irregularly thick-walled bladder, although underdistended. Correlate for cystitis. No renal, ureteral, or bladder calculi. No hydronephrosis. Patchy left lower lobe opacity, suspicious for pneumonia. Mild right basilar opacity, likely atelectasis. Cholelithiasis, without evidence of acute cholecystitis. Electronically Signed   By: Julian Hy M.D.   On: 06/19/2020 12:41   DG Chest 2 View  Result Date: 06/19/2020 CLINICAL DATA:  Suspected sepsis EXAM: CHEST - 2 VIEW COMPARISON:  October 12, 2012 FINDINGS: The heart size and mediastinal contours are within normal limits. Patchy airspace opacity seen within the retrocardiac region. The right lung is clear. No pleural effusion is seen. The visualized skeletal structures are unremarkable. IMPRESSION: Patchy retrocardiac opacity which could be due to atelectasis and/or infectious etiology. Electronically Signed   By: Prudencio Pair M.D.   On: 06/19/2020 03:31        Scheduled Meds: . aspirin EC  81 mg Oral Daily  . enoxaparin (LOVENOX) injection  40 mg Subcutaneous Q24H  . folic acid  1 mg Oral Daily  . multivitamin with minerals  1 tablet Oral Daily  . potassium chloride  40 mEq Oral Q4H  . rosuvastatin  40 mg Oral Daily  . sodium chloride flush  3 mL Intravenous Q12H  . thiamine  100 mg Oral Daily   Or  . thiamine  100 mg Intravenous Daily   Continuous Infusions: . sodium chloride 100 mL/hr at 06/20/20 0855  . azithromycin    .  cefTRIAXone (ROCEPHIN)  IV 2 g (06/20/20 1006)     LOS: 0 days    Time spent: 40 minutes    Irine Seal, MD [facility]   To contact the attending provider between 7A-7P or the covering provider during after hours 7P-7A, please log into the web site [email or web address] and access using universal [place] password for that web site. If you do not have the password, please call the hospital operator.  06/20/2020, 12:27 PM

## 2020-06-20 NOTE — TOC Progression Note (Signed)
Transition of Care [facility]) - Progression Note    Patient Details  Name: Jeremiah Garcia MRN: [id number] Date of Birth: 01-09-1946  Transition of [facility]) CM/SW Contact  Coralyn Helling, [place] Phone Number: 06/20/2020, 9:26 AM  Clinical Narrative:     Patient seen in ED 9/2 and given resources by [facility] RNCM.        Expected Discharge Plan and Services                                                 Social Determinants of Health (SDOH) Interventions    Readmission Risk Interventions No flowsheet data found.

## 2020-06-20 NOTE — Progress Notes (Signed)
   06/20/20 1341  Assess: MEWS Score  Temp (!) 101.5 F (38.6 C) (rn notified )  BP 130/69  SpO2 99 %  Assess: MEWS Score  MEWS Temp 2  MEWS Systolic 0  MEWS Pulse 0  MEWS RR 0  MEWS LOC 0  MEWS Score 2  MEWS Score Color Yellow  Assess: if the MEWS score is Yellow or Red  Were vital signs taken at a resting state? Yes  Focused Assessment Change from prior assessment (see assessment flowsheet)  Early Detection of Sepsis Score *See Row Information* Medium  MEWS guidelines implemented *See Row Information* Yes  Treat  MEWS Interventions Administered prn meds/treatments  Pain Scale 0-10  Pain Score 0  Take Vital Signs  Increase Vital Sign Frequency  Yellow: Q 2hr X 2 then Q 4hr X 2, if remains yellow, continue Q 4hrs  Escalate  MEWS: Escalate Yellow: discuss with charge nurse/RN and consider discussing with provider and RRT  Notify: Charge Nurse/RN  Name of Charge Nurse/RN Notified Bri  Date Charge Nurse/RN Notified 06/20/20  Time Charge Nurse/RN Notified 1354  Notify: Provider  Provider Name/Title Dr. Maisie Fus  Date Provider Notified 06/20/20  Time Provider Notified 1354  Notification Type Page  Notification Reason Change in status   Patient given tylenol, at reassessment fever was 98.2

## 2020-06-20 NOTE — Plan of Care (Signed)

## 2020-06-21 DIAGNOSIS — R42 Dizziness and giddiness: Secondary | ICD-10-CM | POA: Diagnosis not present

## 2020-06-21 DIAGNOSIS — R6510 Systemic inflammatory response syndrome (SIRS) of non-infectious origin without acute organ dysfunction: Secondary | ICD-10-CM | POA: Diagnosis not present

## 2020-06-21 DIAGNOSIS — I10 Essential (primary) hypertension: Secondary | ICD-10-CM | POA: Diagnosis not present

## 2020-06-21 DIAGNOSIS — F1010 Alcohol abuse, uncomplicated: Secondary | ICD-10-CM | POA: Diagnosis not present

## 2020-06-21 LAB — COMPREHENSIVE METABOLIC PANEL
ALT: 34 U/L (ref 0–44)
AST: 91 U/L — ABNORMAL HIGH (ref 15–41)
Albumin: 2.5 g/dL — ABNORMAL LOW (ref 3.5–5.0)
Alkaline Phosphatase: 57 U/L (ref 38–126)
Anion gap: 9 (ref 5–15)
BUN: 13 mg/dL (ref 8–23)
CO2: 23 mmol/L (ref 22–32)
Calcium: 8.7 mg/dL — ABNORMAL LOW (ref 8.9–10.3)
Chloride: 105 mmol/L (ref 98–111)
Creatinine, Ser: 1.06 mg/dL (ref 0.61–1.24)
GFR calc Af Amer: >60 mL/min (ref >60)
GFR calc non Af Amer: >60 mL/min (ref >60)
Glucose, Bld: 86 mg/dL (ref 70–99)
Potassium: 3.2 mmol/L — ABNORMAL LOW (ref 3.5–5.1)
Sodium: 137 mmol/L (ref 135–145)
Total Bilirubin: 0.6 mg/dL (ref 0.3–1.2)
Total Protein: 6.5 g/dL (ref 6.5–8.1)

## 2020-06-21 LAB — CBC WITH DIFFERENTIAL/PLATELET
Abs Immature Granulocytes: 0.05 10*3/uL (ref 0.00–0.07)
Basophils Absolute: 0 10*3/uL (ref 0.0–0.1)
Basophils Relative: 0 %
Eosinophils Absolute: 0 10*3/uL (ref 0.0–0.5)
Eosinophils Relative: 0 %
HCT: 45.9 % (ref 39.0–52.0)
Hemoglobin: 14.5 g/dL (ref 13.0–17.0)
Immature Granulocytes: 1 %
Lymphocytes Relative: 12 %
Lymphs Abs: 0.8 10*3/uL (ref 0.7–4.0)
MCH: 28 pg (ref 26.0–34.0)
MCHC: 31.6 g/dL (ref 30.0–36.0)
MCV: 88.6 fL (ref 80.0–100.0)
Monocytes Absolute: 0.9 10*3/uL (ref 0.1–1.0)
Monocytes Relative: 14 %
Neutro Abs: 4.9 10*3/uL (ref 1.7–7.7)
Neutrophils Relative %: 73 %
Platelets: 240 10*3/uL (ref 150–400)
RBC: 5.18 MIL/uL (ref 4.22–5.81)
RDW: 15 % (ref 11.5–15.5)
WBC: 6.7 10*3/uL (ref 4.0–10.5)
nRBC: 0 % (ref 0.0–0.2)

## 2020-06-21 LAB — MAGNESIUM: Magnesium: 2.4 mg/dL (ref 1.7–2.4)

## 2020-06-21 LAB — HEPATITIS PANEL, ACUTE
HCV Ab: REACTIVE — AB
Hep A IgM: NONREACTIVE
Hep B C IgM: NONREACTIVE
Hepatitis B Surface Ag: NONREACTIVE

## 2020-06-21 LAB — PHOSPHORUS: Phosphorus: 1.7 mg/dL — ABNORMAL LOW (ref 2.5–4.6)

## 2020-06-21 LAB — VITAMIN B12: Vitamin B-12: 486 pg/mL (ref 180–914)

## 2020-06-21 LAB — VITAMIN B1: Vitamin B1 (Thiamine): UNDETERMINED

## 2020-06-21 MED ORDER — POTASSIUM PHOSPHATES 15 MMOLE/5ML IV SOLN
30.0000 mmol | Freq: Once | INTRAVENOUS | Status: AC
  Administered 2020-06-21: 30 mmol via INTRAVENOUS
  Filled 2020-06-21: qty 10

## 2020-06-21 MED ORDER — POTASSIUM CHLORIDE CRYS ER 20 MEQ PO TBCR
40.0000 meq | EXTENDED_RELEASE_TABLET | ORAL | Status: AC
  Administered 2020-06-21 (×2): 40 meq via ORAL
  Filled 2020-06-21 (×2): qty 2

## 2020-06-21 MED ORDER — SODIUM CHLORIDE 0.9 % IV SOLN
25.0000 mg | Freq: Four times a day (QID) | INTRAVENOUS | Status: DC | PRN
  Filled 2020-06-21: qty 1

## 2020-06-21 NOTE — Evaluation (Signed)
Occupational Therapy Evaluation Patient Details Name: Jeremiah Garcia MRN: [id number] DOB: [birth year] Today's Date: 06/21/2020    History of Present Illness Pt admitted with fever and urinary frequency and now with dx of SIRS vs sepsis.  Pt with hx of CVA, ETOH abuse and tremor   Clinical Impression   Pt admitted with fever. Pt currently with functional limitations due to the deficits listed below (see OT Problem List).  Pt will benefit from skilled OT to increase their safety and independence with ADL and functional mobility for ADL to facilitate discharge to venue listed below.      Follow Up Recommendations  Home health OT;SNF    Equipment Recommendations  None recommended by OT    Recommendations for Other Services       Precautions / Restrictions Precautions Precautions: Fall Restrictions Weight Bearing Restrictions: No      Mobility Bed Mobility Overal bed mobility: Needs Assistance Bed Mobility: Supine to Sit     Supine to sit: Min assist     General bed mobility comments: OOB in recliner  Transfers Overall transfer level: Needs assistance Equipment used: Rolling walker (2 wheeled) Transfers: Sit to/from [employer] Sit to Stand: Min assist;From elevated surface Stand pivot transfers: Min assist       General transfer comment: <25% VC's on safety with turns.  Good self use of hands to steady self.    Balance Overall balance assessment: Needs assistance Sitting-balance support: Feet supported;No upper extremity supported Sitting balance-Leahy Scale: Good     Standing balance support: No upper extremity supported Standing balance-Leahy Scale: Fair                             ADL either performed or assessed with clinical judgement   ADL Overall ADL's : Needs assistance/impaired Eating/Feeding: Minimal assistance;Sitting   Grooming: Minimal assistance;Sitting   Upper Body Bathing: Minimal assistance;Sitting   Lower  Body Bathing: Moderate assistance;Sit to/from stand;Cueing for safety;Cueing for sequencing   Upper Body Dressing : Minimal assistance   Lower Body Dressing: Moderate assistance;Sit to/from stand;Cueing for safety;Cueing for sequencing   Toilet Transfer: Minimal assistance;Moderate assistance;Cueing for sequencing;Cueing for safety;Stand-pivot   Toileting- Clothing Manipulation and Hygiene: Minimal assistance;Moderate assistance;Sit to/from stand;Cueing for safety;Cueing for sequencing               Vision Patient Visual Report: No change from baseline              Pertinent Vitals/Pain Pain Assessment: No/denies pain     Hand Dominance     Extremity/Trunk Assessment Upper Extremity Assessment Upper Extremity Assessment: Generalized weakness           Communication Communication Communication: No difficulties   Cognition Arousal/Alertness: Awake/alert Behavior During Therapy: WFL for tasks assessed/performed Overall Cognitive Status: Within Functional Limits for tasks assessed                                 General Comments: AxO x 4 pleasant following all directions.  Polite.   General Comments               Home Living Family/patient expects to be discharged to:: Unsure                                 Additional Comments: Unclear if pt is  homeless or renting space is a friend's apartment.      Prior Functioning/Environment Level of Independence: Independent        Comments: Pt states IND with ambulation and all ADL        OT Problem List: Decreased strength;Impaired balance (sitting and/or standing);Decreased activity tolerance      OT Treatment/Interventions: Self-care/ADL training;Patient/family education;Therapeutic activities    OT Goals(Current goals can be found in the care plan section) Acute Rehab OT Goals Patient Stated Goal: Regain IND OT Goal Formulation: With patient Time For Goal Achievement:  07/05/20 Potential to Achieve Goals: Good  OT Frequency: Min 2X/week   Barriers to D/C:               AM-PAC OT "6 Clicks" Daily Activity     Outcome Measure Help from another person eating meals?: A Little Help from another person taking care of personal grooming?: A Little Help from another person toileting, which includes using toliet, bedpan, or urinal?: A Little Help from another person bathing (including washing, rinsing, drying)?: A Little Help from another person to put on and taking off regular upper body clothing?: A Little Help from another person to put on and taking off regular lower body clothing?: A Lot 6 Click Score: 17   End of Session Nurse Communication: Mobility status  Activity Tolerance: Patient tolerated treatment well Patient left: in chair  OT Visit Diagnosis: Unsteadiness on feet (R26.81);Muscle weakness (generalized) (M62.81)                Time: 1413-1430 OT Time Calculation (min): 17 min Charges:  OT General Charges $OT Visit: 1 Visit OT Evaluation $OT Eval Moderate Complexity: 1 Mod  Lise Auer, OT Acute Rehabilitation Services [phone] Office- [phone]     Amna Welker, Karin Golden D 06/21/2020, 3:18 PM

## 2020-06-21 NOTE — Care Management Important Message (Signed)
Important Message  Patient Details IM Letter given to the Patient Name: Jeremiah Garcia MRN: [id number] Date of Birth: 04/02/1946   Medicare Important Message Given:  Yes     Caren Macadam 06/21/2020, 1:33 PM

## 2020-06-21 NOTE — Progress Notes (Signed)
Physical Therapy Treatment Patient Details Name: Jeremiah Garcia MRN: [id number] DOB: April 08, 1946 Today's Date: 06/21/2020    History of Present Illness Pt admitted with fever and urinary frequency and now with dx of SIRS vs sepsis.  Pt with hx of CVA, ETOH abuse and tremor    PT Comments    Pt OOB in recliner.  Assisted with amb in hallway.  General Gait Details: progressed from RW to pushing IV pole and tolerated an increased distance.  No c/o pain.  No c/o dizziness.  Mild c/o fatigue.  Also assisted with amb to bathroom to void in a standing position.  No LOB.  Follow Up Recommendations  Other (comment) (determined by [facility])     Equipment Recommendations  None recommended by PT    Recommendations for Other Services       Precautions / Restrictions Precautions Precautions: Fall    Mobility  Bed Mobility               General bed mobility comments: OOB in recliner  Transfers Overall transfer level: Needs assistance Equipment used: None Transfers: Sit to/from [employer] Transfers Sit to Stand: Supervision Stand pivot transfers: Supervision;Min guard       General transfer comment: <25% VC's on safety with turns.  Good self use of hands to steady self.  Ambulation/Gait Ambulation/Gait assistance: Supervision;Min guard Gait Distance (Feet): 75 Feet Assistive device: IV Pole Gait Pattern/deviations: Step-to pattern;Step-through pattern;Decreased step length - right;Decreased step length - left;Shuffle;Trunk flexed Gait velocity: decreased   General Gait Details: progressed from RW to pushing IV pole and tolerated an increased distance.  No c/o pain.  No c/o dizziness.  Mild c/o fatigue.  Also assisted with amb to bathroom to void in a standing position.  No LOB.   Stairs             Wheelchair Mobility    Modified Rankin (Stroke Patients Only)       Balance                                            Cognition  Arousal/Alertness: Awake/alert Behavior During Therapy: WFL for tasks assessed/performed Overall Cognitive Status: Within Functional Limits for tasks assessed                                 General Comments: AxO x 4 pleasant following all directions.  Polite.      Exercises      General Comments        Pertinent Vitals/Pain Pain Assessment: No/denies pain    Home Living                      Prior Function            PT Goals (current goals can now be found in the care plan section) Progress towards PT goals: Progressing toward goals    Frequency    Min 3X/week      PT Plan Current plan remains appropriate    Co-evaluation              AM-PAC PT "6 Clicks" Mobility   Outcome Measure  Help needed turning from your back to your side while in a flat bed without using bedrails?: None Help needed moving from lying on your back  to sitting on the side of a flat bed without using bedrails?: None Help needed moving to and from a bed to a chair (including a wheelchair)?: A Little Help needed standing up from a chair using your arms (e.g., wheelchair or bedside chair)?: A Little Help needed to walk in hospital room?: A Little Help needed climbing 3-5 steps with a railing? : A Lot 6 Click Score: 19    End of Session Equipment Utilized During Treatment: Gait belt Activity Tolerance: Patient tolerated treatment well Patient left: in chair;with call bell/phone within reach;with chair alarm set Nurse Communication: Mobility status PT Visit Diagnosis: Difficulty in walking, not elsewhere classified (R26.2);Muscle weakness (generalized) (M62.81)     Time: 0881-1031 PT Time Calculation (min) (ACUTE ONLY): 22 min  Charges:  $Gait Training: 8-22 mins                     Felecia Shelling  PTA Acute  Rehabilitation Services Pager      [phone] Office      [phone]

## 2020-06-21 NOTE — TOC Initial Note (Addendum)
Transition of Care [facility]) - Initial/Assessment Note    Patient Details  Name: Jeremiah Garcia MRN: [id number] Date of Birth: Nov 13, 1945  [facility] CM/SW Contact:    Lanier Clam, RN Phone Number: 06/21/2020, 11:34 AM  Clinical Narrative: Spoke to patient about d/c plan-staes he is currently living with a friend-but there are dynamics that he doesn't want to be involved in,therefore he will not d/c back there. Permission for CM to f/u on [facility]-referral sent-[facility] @ d/c,will provide bus pass.Patient's dtr Mahalia Longest the contact [phone] will come tomorrow.Patient wants independent living. ETOH resources given.                  Expected Discharge Plan: Homeless Shelter Barriers to Discharge: Continued Medical Work up   Patient Goals and CMS Choice Patient states their goals for this hospitalization and ongoing recovery are:: transitional housing CMS Medicare.gov Compare Post Acute Care list provided to:: Patient Choice offered to / list presented to : Patient  Expected Discharge Plan and Services Expected Discharge Plan: Homeless Shelter   Discharge Planning Services: CM Consult Post Acute Care Choice:  (transitional housing) Living arrangements for the past 2 months: Homeless Shelter                                      Prior Living Arrangements/Services Living arrangements for the past 2 months: Homeless Shelter Lives with:: Friends Patient language and need for interpreter reviewed:: Yes Do you feel safe going back to the place where you live?: Yes      Need for Family Participation in Patient Care: No (Comment) Care giver support system in place?: Yes (comment)   Criminal Activity/Legal Involvement Pertinent to Current Situation/Hospitalization: No - Comment as needed  Activities of Daily Living Home Assistive Devices/Equipment: None ADL Screening (condition at time of admission) Patient's cognitive ability adequate to safely  complete daily activities?: Yes Is the patient deaf or have difficulty hearing?: No Does the patient have difficulty seeing, even when wearing glasses/contacts?: No Does the patient have difficulty concentrating, remembering, or making decisions?: Yes Patient able to express need for assistance with ADLs?: Yes Does the patient have difficulty dressing or bathing?: No Independently performs ADLs?: Yes (appropriate for developmental age) Does the patient have difficulty walking or climbing stairs?: Yes Weakness of Legs: None Weakness of Arms/Hands: Left  Permission Sought/Granted Permission sought to share information with : Case Manager Permission granted to share information with : Yes, Verbal Permission Granted  Share Information with NAME: Case manager           Emotional Assessment Appearance:: Appears stated age Attitude/Demeanor/Rapport: Gracious Affect (typically observed): Accepting Orientation: : Oriented to Self, Oriented to Place, Oriented to  Time, Oriented to Situation Alcohol / Substance Use: Alcohol Use Psych Involvement: No (comment)  Admission diagnosis:  SIRS (systemic inflammatory response syndrome) (HCC) [R65.10] Patient Active Problem List   Diagnosis Date Noted  . Community acquired pneumonia   . Bowel and bladder incontinence   . Urinary urgency   . SIRS (systemic inflammatory response syndrome) (HCC) 06/19/2020  . Postural dizziness with presyncope 06/19/2020  . Alcohol abuse 06/19/2020  . 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 EMPHYSEMA 12/09/2010  . ACUTE CYSTITIS 11/16/2010  . COUGH 11/16/2010  . DENTAL CARIES 08/22/2010  . Hyperlipidemia 05/17/2010  . GLUCOSE INTOLERANCE 05/16/2010  . Essential hypertension, benign 05/16/2010  .  TOBACCO ABUSE 03/14/2010  . CAROTID ARTERY STENOSIS, 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   PCP:  Jackie Plum, MD Pharmacy:   [facility] [phone] [address], [place] - [phone] AT  [facility] [address] [place] [address] Phone: [phone] Fax: [phone]     Social Determinants of Health (SDOH) Interventions Housing Interventions: [id number] Referral (Homeless in [place].) Transportation Interventions: [id number] Referral (Transportation-bus pass.) Alcohol Brief Interventions/Follow-up: Alcohol Education  Readmission Risk Interventions No flowsheet data found.

## 2020-06-21 NOTE — Progress Notes (Signed)
SLP Cancellation Note  Patient Details Name: Jeremiah Garcia MRN: [id number] DOB: 1946-02-20   Cancelled treatment:        Attempted x2 to see pt for swallowing assessment.  Pt with other providers on both attempts.  SLP will reattempt as schedule permits.   Latresha Yahr E Miasia Crabtree 06/21/2020, 10:50 AM

## 2020-06-21 NOTE — Progress Notes (Addendum)
PROGRESS NOTE    Jeremiah Garcia  FOY:[id number] DOB: 07-10-1946 DOA: 06/19/2020 PCP: Benito Mccreedy, MD    Chief Complaint  Patient presents with  . Urinary Frequency  . Fever    Brief Narrative:  HPI per Dr. Neysa Bonito Patient is a poor historian and much of the history is obtained from prior notes as well as his granddaughter, Jeremiah Garcia, over the phone  This is a 74 year old male with past medical history of CVA with residual LUE weakness, hypertension, hyperlipidemia, alcohol abuse, tobacco use who presented to the ED for evaluation of presyncopal episode yesterday.  According to the patient, he was concerned that he may have a UTI as he has been urinating on himself frequently over the past month and more so over the last several days and denies any dysuria or known fevers.  According to the ED provider note, patient was sitting outside under a tent with his friends when he got up and tried to switch chairs when he felt lightheaded and almost fell down to the ground however his friend caught him and never hit the ground.  Felt lightheaded when he stood up from sitting. He denies any known fever, cough, sick contacts. He reports that his last drink was two days ago and drinks alcohol daily.   According to his granddaughter, the patient was staying with her in [place], [place] and then moved back to [place] recently. She mentioned he is an alcoholic, and she noticed he was shaking a lot and was getting lost frequently. There was some concern for dementia and she mentioned that he also may have a history of renal stones.   ED Course: ED vitals: T 103.67F, HR 102, BP 161/81, 97% on room air.  Notable labs: Sodium 130, glucose 123, AST 48, ALT 23, lactic acid 1.7, WBC 12.7.  COVID-19 and flu negative.  UA cloudy with large Hb, 5 ketones, negative nitrites, negative leukocytes, protein> 300, rare bacteria, WBCs 11-20.  CXR with patchy retrocardiac opacity which could be due to atelectasis  and/or infectious etiology.  He was given ceftriaxone and azithromycin in the ED for CAP coverage.   Assessment & Plan:   Principal Problem:   SIRS (systemic inflammatory response syndrome) (HCC) Active Problems:   Hyperlipidemia   Essential hypertension, benign   Postural dizziness with presyncope   Alcohol abuse   Community acquired pneumonia   Bowel and bladder incontinence   Urinary urgency  1 systemic inflammatory response concern for developing sepsis with likely source community-acquired pneumonia Patient admitted AND met criteria for SIRS with fever, tachycardia, leukocytosis.  No lactic acidosis.  Chest x-ray done concerning for retrocardiac opacity concerning for pneumonia.  CT abdomen and pelvis with irregularly thick-walled bladder, underdistended.  Patchy left lower lobe opacity suspicious for pneumonia.  Mild right basilar opacity likely atelectasis.  Urinalysis with pyuria.  Urine cultures negative.  Blood cultures pending.  Continue empiric IV Rocephin and azithromycin.  Follow.   2.  Postural dizziness with presyncope Likely secondary to dehydration and volume depletion.  Improved with hydration. Continue IV fluids.  Follow.  3.  Alcohol abuse, concern for withdrawal Patient noted to have had his last drink 2 to 3 days prior to admission.  Patient with some occasional tremulousness.  Alcohol level negative.  UDS negative.  Ammonia level at 13.  Thiamine level pending.  Continue Ativan withdrawal protocol, thiamine, folic acid, multivitamin.  IV fluids.  Supportive care.    4.  Bowel and bladder incontinence Per family patient  with bowel and bladder incontinence.  Patient may have some overflow incontinence.  CT abdomen and pelvis with no significant abnormalities noted.  Improvement with bladder incontinence after being started on Ditropan.  Improvement with bowel incontinence per staff.  Patient denies any further bowel or bladder incontinence at this time.  No back pain.   D/C MRI of the T and L-spine.  Follow.   5.  Urinary urgency/frequency Patient with some hematuria and proteinuria on UA.  CT abdomen and pelvis negative for any ureteral stones.  Hemoglobin A1c at 5.9.  Clinical improvement on Ditropan.  Follow.    6.  Hypertension She was borderline which is slowly improving.  Continue to hold antihypertensive medications of lisinopril and Norvasc.  Follow.   7.  Prior history of CVA Continue aspirin, statin.  Hemoglobin A1c 5.9.  PT/OT.  8.  Suspected underlying dementia plus or minus Parkinson's Patient with some questionable tremors, likely secondary to alcohol withdrawal.  Tremors improved.  Thiamine level pending.  Vitamin B12 levels of 486.  Will need outpatient PCP.    9.  Poor oral intake Likely secondary to alcohol abuse.  Patient also states difficult for him to chew his food due to problems with his teeth and mouth pain and as such diet was changed to a soft diet.Marland KitchenSLP.  Follow.  10.  Homelessness Social work consulted.    11.  Hypokalemia/hypophosphatemia Replete.   DVT prophylaxis: Lovenox Code Status: Full Family Communication: Updated patient.  No family at bedside.  Disposition:   Status is: Inpatient    Dispo: The patient is from: Homeless              Anticipated d/c is to: Patient is homeless              Anticipated d/c date is: 3 to 4 days.              Patient currently on IV antibiotics for pneumonia, electrolyte abnormalities, work-up underway, appears to be homeless per granddaughter who was at bedside and per daughter on the telephone who lives in [place].  Still with fevers.  Not stable for discharge.       Consultants:   None  Procedures:   CT abdomen and pelvis 06/19/2020  Chest x-ray 06/19/2020  Antimicrobials:   IV Rocephin 06/19/2020  IV azithromycin 06/19/2020   Subjective: Sitting up in chair.  Denies any chest pain or shortness of breath.  Denies any tremors.  States urinary  incontinence has improved over the past 24 hours.  Denies any bowel incontinence.  Feeling better than he did on admission.  Fever curve trending down.  T-max of 101.5 at 1341 hrs. on June 20, 2020.  Objective: Vitals:   06/20/20 1742 06/20/20 2140 06/21/20 0140 06/21/20 0507  BP: 112/68 120/62 110/70 110/65  Pulse: 66 62 67 70  Resp: $Remo'20 18 18 18  'YWQVI$ Temp: 98 F (36.7 C) 97.9 F (36.6 C) 98 F (36.7 C) 98.2 F (36.8 C)  TempSrc: Oral  Oral   SpO2: 99% 97% 98% 96%  Weight:      Height:        Intake/Output Summary (Last 24 hours) at 06/21/2020 1116 Last data filed at 06/21/2020 0812 Gross per 24 hour  Intake 2651.43 ml  Output 4300 ml  Net -1648.57 ml   Filed Weights   06/19/20 0209 06/19/20 0211  Weight: 86.2 kg 86.2 kg    Examination:  General exam: NAD Respiratory system: CTAB.  No wheezes,  no crackles, no rhonchi.  Normal respiratory effort.   Cardiovascular system: Regular rate rhythm no murmurs rubs or gallops.  No JVD.  No lower extremity edema.  Gastrointestinal system: Abdomen is soft, nontender, nondistended, positive bowel sounds.  No rebound.  No guarding. Central nervous system: Alert and oriented. No focal neurological deficits. Extremities: Symmetric 5 x 5 power. Skin: No rashes, lesions or ulcers Psychiatry: Judgement and insight appear fair. Mood & affect appropriate.     Data Reviewed: I have personally reviewed following labs and imaging studies  CBC: Recent Labs  Lab 06/18/20 2041 06/19/20 1644 06/20/20 0601 06/21/20 0545  WBC 12.7* 10.7* 9.5 6.7  NEUTROABS 10.7*  --   --  4.9  HGB 13.9 13.9 14.1 14.5  HCT 42.2 42.3 43.7 45.9  MCV 85.9 86.3 87.8 88.6  PLT 211 219 208 454    Basic Metabolic Panel: Recent Labs  Lab 06/18/20 2041 06/19/20 1644 06/19/20 1650 06/20/20 0601 06/21/20 0545  NA 130*  --  135 136 137  K 3.7  --  3.8 3.0* 3.2*  CL 98  --  97* 103 105  CO2 22  --  $R'26 23 23  'xs$ GLUCOSE 123*  --  89 97 86  BUN 14  --  $R'15 14 13   'Hu$ CREATININE 1.11  --  1.16 0.98 1.06  CALCIUM 8.5*  --  8.9 8.4* 8.7*  MG  --  2.4  --   --  2.4  PHOS  --  2.5  --   --  1.7*    GFR: Estimated Creatinine Clearance: 71.1 mL/min (by C-G formula based on SCr of 1.06 mg/dL).  Liver Function Tests: Recent Labs  Lab 06/18/20 2041 06/19/20 1650 06/20/20 0601 06/21/20 0545  AST 48* 67* 69* 91*  ALT $Re'23 29 27 'igd$ 34  ALKPHOS 68 67 58 57  BILITOT 1.2 1.3* 1.1 0.6  PROT 7.6 7.9 6.4* 6.5  ALBUMIN 3.2* 3.1* 2.4* 2.5*    CBG: No results for input(s): GLUCAP in the last 168 hours.   Recent Results (from the past 240 hour(s))  Culture, blood (Routine x 2)     Status: None (Preliminary result)   Collection Time: 06/19/20  2:44 AM   Specimen: BLOOD  Result Value Ref Range Status   Specimen Description   Final    BLOOD RIGHT ARM Performed at [facility] [address], [place], [place] [address]    Special Requests   Final    BOTTLES DRAWN AEROBIC AND ANAEROBIC Blood Culture adequate volume Performed at [facility] [address] [place], [place] [address]    Culture   Final    NO GROWTH 2 DAYS Performed at [facility] [address], [place], [place] [address]    Report Status PENDING  Incomplete  Respiratory Panel by RT PCR (Flu A&B, Covid) - Nasopharyngeal Swab     Status: None   Collection Time: 06/19/20  5:29 AM   Specimen: Nasopharyngeal Swab  Result Value Ref Range Status   SARS Coronavirus 2 by RT PCR NEGATIVE NEGATIVE Final    Comment: (NOTE) SARS-CoV-2 target nucleic acids are NOT DETECTED.  The SARS-CoV-2 RNA is generally detectable in upper respiratoy specimens during the acute phase of infection. The lowest concentration of SARS-CoV-2 viral copies this assay can detect is 131 copies/mL. A negative result does not preclude SARS-Cov-2 infection and should not be used as the sole basis for treatment or other patient management decisions. A  negative result may occur  with  improper specimen collection/handling, submission of specimen other than nasopharyngeal swab, presence of viral mutation(s) within the areas targeted by this assay, and inadequate number of viral copies (<131 copies/mL). A negative result must be combined with clinical observations, patient history, and epidemiological information. The expected result is Negative.  Fact Sheet for Patients:  https://www.moore.com/  Fact Sheet for Healthcare Providers:  https://www.young.biz/  This test is no t yet approved or cleared by the [place] FDA and  has been authorized for detection and/or diagnosis of SARS-CoV-2 by FDA under an Emergency Use Authorization (EUA). This EUA will remain  in effect (meaning this test can be used) for the duration of the COVID-19 declaration under Section 564(b)(1) of the Act, 21 U.S.C. section 360bbb-3(b)(1), unless the authorization is terminated or revoked sooner.     Influenza A by PCR NEGATIVE NEGATIVE Final   Influenza B by PCR NEGATIVE NEGATIVE Final    Comment: (NOTE) The Xpert Xpress SARS-CoV-2/FLU/RSV assay is intended as an aid in  the diagnosis of influenza from Nasopharyngeal swab specimens and  should not be used as a sole basis for treatment. Nasal washings and  aspirates are unacceptable for Xpert Xpress SARS-CoV-2/FLU/RSV  testing.  Fact Sheet for Patients: https://www.moore.com/  Fact Sheet for Healthcare Providers: https://www.young.biz/  This test is not yet approved or cleared by the [place] FDA and  has been authorized for detection and/or diagnosis of SARS-CoV-2 by  FDA under an Emergency Use Authorization (EUA). This EUA will remain  in effect (meaning this test can be used) for the duration of the  Covid-19 declaration under Section 564(b)(1) of the Act, 21  U.S.C. section 360bbb-3(b)(1), unless the authorization is  terminated or  revoked. Performed at [facility], [address] [place], [place] [address]   Urine culture     Status: Abnormal   Collection Time: 06/19/20  8:05 AM   Specimen: Urine, Random  Result Value Ref Range Status   Specimen Description   Final    URINE, RANDOM Performed at [facility], [address], [place], [place] [address]    Special Requests   Final    NONE Performed at [facility], [address], [place], [place] [address]    Culture (A)  Final    <10,000 COLONIES/mL INSIGNIFICANT GROWTH Performed at [facility], [address] [place], [place] [address]    Report Status 06/20/2020 FINAL  Final  Culture, blood (Routine x 2)     Status: None (Preliminary result)   Collection Time: 06/19/20 11:15 AM   Specimen: BLOOD  Result Value Ref Range Status   Specimen Description   Final    BLOOD LEFT ANTECUBITAL Performed at [facility], [address], [place], [place] [address]    Special Requests   Final    BOTTLES DRAWN AEROBIC AND ANAEROBIC Blood Culture adequate volume Performed at [facility], [address], [place], [place] [address]    Culture   Final    NO GROWTH 2 DAYS Performed at [facility], [address] [place], [place] [address]    Report Status PENDING  Incomplete         Radiology Studies: CT ABDOMEN PELVIS WO CONTRAST  Result Date: 06/19/2020 CLINICAL DATA:  Flank pain, urinary frequency EXAM: CT ABDOMEN AND PELVIS WITHOUT CONTRAST TECHNIQUE: Multidetector CT imaging of the abdomen and pelvis was performed following the standard  protocol without IV contrast. COMPARISON:  10/30/2013 FINDINGS: Lower chest: Patchy left lower lobe opacity, suspicious for pneumonia. Mild right basilar opacity, atelectasis versus pneumonia. Hepatobiliary: Unenhanced liver is unremarkable. Layering small gallstones. No gallbladder wall thickening or pericholecystic  fluid. No intrahepatic or extrahepatic ductal dilatation. Pancreas: Within normal limits. Spleen: Within normal limits. Adrenals/Urinary Tract: Adrenal glands are within normal limits. Kidneys are within normal limits. No renal, ureteral, or bladder calculi. No hydronephrosis. Irregularly thick-walled bladder (series 2/image 66), although underdistended. Stomach/Bowel: Stomach is within normal limits. No evidence of bowel obstruction. Appendix is not discretely visualized. Colonic diverticulosis, without evidence of diverticulitis. Vascular/Lymphatic: No evidence of abdominal aortic aneurysm. Atherosclerotic calcifications of the abdominal aorta and branch vessels. No suspicious abdominopelvic lymphadenopathy. Reproductive: Prostate is unremarkable. Other: No abdominopelvic ascites. Musculoskeletal: Mild degenerative changes of the visualized thoracolumbar spine, most prominent at L5-S1. IMPRESSION: Irregularly thick-walled bladder, although underdistended. Correlate for cystitis. No renal, ureteral, or bladder calculi. No hydronephrosis. Patchy left lower lobe opacity, suspicious for pneumonia. Mild right basilar opacity, likely atelectasis. Cholelithiasis, without evidence of acute cholecystitis. Electronically Signed   By: Julian Hy M.D.   On: 06/19/2020 12:41        Scheduled Meds: . aspirin EC  81 mg Oral Daily  . enoxaparin (LOVENOX) injection  40 mg Subcutaneous Q24H  . folic acid  1 mg Oral Daily  . multivitamin with minerals  1 tablet Oral Daily  . oxybutynin  5 mg Oral TID  . potassium chloride  40 mEq Oral Q4H  . rosuvastatin  40 mg Oral Daily  . sodium chloride flush  3 mL Intravenous Q12H  . thiamine  100 mg Oral Daily   Or  . thiamine  100 mg Intravenous Daily   Continuous Infusions: . sodium chloride 100 mL/hr at 06/21/20 0634  . azithromycin 500 mg (06/20/20 1250)  . cefTRIAXone (ROCEPHIN)  IV 2 g (06/20/20 1006)  . chlorproMAZINE (THORAZINE) IV    . potassium  PHOSPHATE IVPB (in mmol)       LOS: 1 day    Time spent: 40 minutes    Irine Seal, MD [facility]   To contact the attending provider between 7A-7P or the covering provider during after hours 7P-7A, please log into the web site [email or web address] and access using universal [place] password for that web site. If you do not have the password, please call the hospital operator.  06/21/2020, 11:16 AM

## 2020-06-22 DIAGNOSIS — R32 Unspecified urinary incontinence: Secondary | ICD-10-CM | POA: Diagnosis not present

## 2020-06-22 DIAGNOSIS — J189 Pneumonia, unspecified organism: Secondary | ICD-10-CM | POA: Diagnosis not present

## 2020-06-22 DIAGNOSIS — R6510 Systemic inflammatory response syndrome (SIRS) of non-infectious origin without acute organ dysfunction: Secondary | ICD-10-CM | POA: Diagnosis not present

## 2020-06-22 DIAGNOSIS — F1010 Alcohol abuse, uncomplicated: Secondary | ICD-10-CM | POA: Diagnosis not present

## 2020-06-22 LAB — CBC
HCT: 43.4 % (ref 39.0–52.0)
Hemoglobin: 13.9 g/dL (ref 13.0–17.0)
MCH: 28 pg (ref 26.0–34.0)
MCHC: 32 g/dL (ref 30.0–36.0)
MCV: 87.3 fL (ref 80.0–100.0)
Platelets: 243 10*3/uL (ref 150–400)
RBC: 4.97 MIL/uL (ref 4.22–5.81)
RDW: 15.1 % (ref 11.5–15.5)
WBC: 5.3 10*3/uL (ref 4.0–10.5)
nRBC: 0 % (ref 0.0–0.2)

## 2020-06-22 LAB — COMPREHENSIVE METABOLIC PANEL
ALT: 38 U/L (ref 0–44)
AST: 96 U/L — ABNORMAL HIGH (ref 15–41)
Albumin: 2.5 g/dL — ABNORMAL LOW (ref 3.5–5.0)
Alkaline Phosphatase: 61 U/L (ref 38–126)
Anion gap: 10 (ref 5–15)
BUN: 11 mg/dL (ref 8–23)
CO2: 23 mmol/L (ref 22–32)
Calcium: 8.8 mg/dL — ABNORMAL LOW (ref 8.9–10.3)
Chloride: 106 mmol/L (ref 98–111)
Creatinine, Ser: 1.07 mg/dL (ref 0.61–1.24)
GFR calc Af Amer: >60 mL/min (ref >60)
GFR calc non Af Amer: >60 mL/min (ref >60)
Glucose, Bld: 92 mg/dL (ref 70–99)
Potassium: 3.4 mmol/L — ABNORMAL LOW (ref 3.5–5.1)
Sodium: 139 mmol/L (ref 135–145)
Total Bilirubin: 0.6 mg/dL (ref 0.3–1.2)
Total Protein: 6.6 g/dL (ref 6.5–8.1)

## 2020-06-22 LAB — PHOSPHORUS: Phosphorus: 3 mg/dL (ref 2.5–4.6)

## 2020-06-22 MED ORDER — AZITHROMYCIN 250 MG PO TABS
500.0000 mg | ORAL_TABLET | Freq: Every day | ORAL | Status: AC
  Administered 2020-06-22 – 2020-06-23 (×2): 500 mg via ORAL
  Filled 2020-06-22 (×2): qty 2

## 2020-06-22 MED ORDER — CEFDINIR 300 MG PO CAPS
300.0000 mg | ORAL_CAPSULE | Freq: Two times a day (BID) | ORAL | Status: AC
  Administered 2020-06-22 – 2020-06-23 (×3): 300 mg via ORAL
  Filled 2020-06-22 (×3): qty 1

## 2020-06-22 MED ORDER — NICOTINE 14 MG/24HR TD PT24
14.0000 mg | MEDICATED_PATCH | Freq: Every day | TRANSDERMAL | Status: DC
  Administered 2020-06-22 – 2020-06-30 (×9): 14 mg via TRANSDERMAL
  Filled 2020-06-22 (×10): qty 1

## 2020-06-22 MED ORDER — POTASSIUM CHLORIDE CRYS ER 20 MEQ PO TBCR
40.0000 meq | EXTENDED_RELEASE_TABLET | Freq: Once | ORAL | Status: AC
  Administered 2020-06-22: 40 meq via ORAL
  Filled 2020-06-22: qty 2

## 2020-06-22 MED ORDER — LORAZEPAM 2 MG/ML IJ SOLN
1.0000 mg | Freq: Once | INTRAMUSCULAR | Status: AC
  Administered 2020-06-22: 1 mg via INTRAVENOUS
  Filled 2020-06-22: qty 1

## 2020-06-22 MED ORDER — MAGIC MOUTHWASH W/LIDOCAINE
15.0000 mL | Freq: Four times a day (QID) | ORAL | Status: DC
  Administered 2020-06-22 – 2020-06-30 (×26): 15 mL via ORAL
  Filled 2020-06-22 (×35): qty 15

## 2020-06-22 NOTE — Plan of Care (Signed)

## 2020-06-22 NOTE — Progress Notes (Addendum)
PROGRESS NOTE    Jeremiah Garcia  OZD:[id number] DOB: [birth year] DOA: 06/19/2020 PCP: Jeremiah Mccreedy, MD    Chief Complaint  Patient presents with  . Urinary Frequency  . Fever    Brief Narrative:  HPI per Dr. Neysa Bonito Patient is a poor historian and much of the history is obtained from prior notes as well as his granddaughter, Jeremiah Garcia, over the phone  This is a 74 year old male with past medical history of CVA with residual LUE weakness, hypertension, hyperlipidemia, alcohol abuse, tobacco use who presented to the ED for evaluation of presyncopal episode yesterday.  According to the patient, he was concerned that he may have a UTI as he has been urinating on himself frequently over the past month and more so over the last several days and denies any dysuria or known fevers.  According to the ED provider note, patient was sitting outside under a tent with his friends when he got up and tried to switch chairs when he felt lightheaded and almost fell down to the ground however his friend caught him and never hit the ground.  Felt lightheaded when he stood up from sitting. He denies any known fever, cough, sick contacts. He reports that his last drink was two days ago and drinks alcohol daily.   According to his granddaughter, the patient was staying with her in [place], [place] and then moved back to [place] recently. She mentioned he is an alcoholic, and she noticed he was shaking a lot and was getting lost frequently. There was some concern for dementia and she mentioned that he also may have a history of renal stones.   ED Course: ED vitals: T 103.15F, HR 102, BP 161/81, 97% on room air.  Notable labs: Sodium 130, glucose 123, AST 48, ALT 23, lactic acid 1.7, WBC 12.7.  COVID-19 and flu negative.  UA cloudy with large Hb, 5 ketones, negative nitrites, negative leukocytes, protein> 300, rare bacteria, WBCs 11-20.  CXR with patchy retrocardiac opacity which could be due to atelectasis  and/or infectious etiology.  He was given ceftriaxone and azithromycin in the ED for CAP coverage.   Assessment & Plan:   Principal Problem:   SIRS (systemic inflammatory response syndrome) (HCC) Active Problems:   Hyperlipidemia   Essential hypertension, benign   Postural dizziness with presyncope   Alcohol abuse   Community acquired pneumonia   Bowel and bladder incontinence   Urinary urgency  1 systemic inflammatory response concern for developing sepsis with likely source community-acquired pneumonia Patient admitted AND met criteria for SIRS with fever, tachycardia, leukocytosis.  No lactic acidosis.  Chest x-ray done concerning for retrocardiac opacity concerning for pneumonia.  CT abdomen and pelvis with irregularly thick-walled bladder, underdistended.  Patchy left lower lobe opacity suspicious for pneumonia.  Mild right basilar opacity likely atelectasis.  Urinalysis with pyuria.  Urine cultures negative.  Blood cultures pending.  Change IV azithromycin to oral azithromycin.  Will discontinue IV Rocephin after today's dose.  Start oral Vantin/Omnicef 06/23/2020.  Follow.   2.  Postural dizziness with presyncope Likely secondary to dehydration and volume depletion.  Resolved with hydration.  Follow.   3.  Alcohol abuse, concern for withdrawal Patient noted to have had his last drink 2 to 3 days prior to admission.  Patient with some occasional tremulousness.  Alcohol level negative.  UDS negative.  Ammonia level at 13.  Thiamine level pending.  Continue Ativan withdrawal protocol, thiamine, folic acid, multivitamin.  Saline lock IV fluids.  Follow.  4.  Bowel and bladder incontinence Per family patient with bowel and bladder incontinence.  Patient may have some overflow incontinence.  CT abdomen and pelvis with no significant abnormalities noted.  Improvement with bladder incontinence after being started on Ditropan.  Improvement with bowel incontinence per staff.  Patient denies  any further bowel or bladder incontinence at this time.  No back pain.  MRI of T and L-spine was ordered however this has been discontinued as patient improved clinically. Follow.   5.  Urinary urgency/frequency Patient with some hematuria and proteinuria on UA.  CT abdomen and pelvis negative for any ureteral stones.  Hemoglobin A1c at 5.9.  Improved clinically on Ditropan.  Outpatient follow-up with PCP.  Follow.    6.  Hypertension Blood pressure noted to be borderline on admission.  Blood pressure improved.  Continue to hold lisinopril and Norvasc.  Follow.   7.  Prior history of CVA Stable.  Continue statin, aspirin.  Hemoglobin A1c 5.9.  PT/OT.   8.  Suspected underlying dementia plus or minus Parkinson's Patient with some questionable tremors, likely secondary to alcohol withdrawal.  Tremors improved.  Thiamine level pending.  Vitamin B12 levels of 486.  Continue thiamine, folic acid, multivitamin.  Will need outpatient PCP.    9.  Poor oral intake Likely secondary to alcohol abuse.  Patient also states difficult for him to chew his food due to problems with his teeth and mouth pain and as such diet was changed to a soft diet.Marland KitchenSLP.  Place on Magic mouthwash with lidocaine secondary to mouth pain.  Supportive care.  Follow.  10.  Homelessness Social work consulted.    11.  Hypokalemia/hypophosphatemia Replete.  12.  Transaminitis/ + Hep C antibody Elevated troponin likely secondary to alcohol use.  Hep C antibody positive.  Check hep C RNA.  Will need outpatient follow-up.   DVT prophylaxis: Lovenox Code Status: Full Family Communication: Updated patient.  No family at bedside.  Disposition:   Status is: Inpatient    Dispo: The patient is from: Homeless              Anticipated d/c is to: Patient is homeless              Anticipated d/c date is: 06/23/2020              Patient currently on IV antibiotics for pneumonia, electrolyte abnormalities, work-up underway, appears  to be homeless.  Not stable for discharge.        Consultants:   None  Procedures:   CT abdomen and pelvis 06/19/2020  Chest x-ray 06/19/2020  Antimicrobials:   IV Rocephin 06/19/2020>>>>> 06/22/2020  IV azithromycin 06/19/2020>>>> oral azithromycin 06/22/2020  Oral Vantin/Omnicef 06/23/2020   Subjective: Patient laying in bed.  Denies any chest pain or shortness of breath.  Denies any tremors.  States improvement with urinary incontinence.  Denies any bowel incontinence.  Fever curve trending down.  Tolerating current diet.   Objective: Vitals:   06/21/20 0507 06/21/20 1245 06/21/20 2101 06/22/20 0618  BP: 110/65 114/69 116/66 113/67  Pulse: 70 71 60 65  Resp: 18 (!) $Remo'21 20 18  'CHzxw$ Temp: 98.2 F (36.8 C) 98.8 F (37.1 C) 98 F (36.7 C) 98 F (36.7 C)  TempSrc:  Oral Oral Oral  SpO2: 96% 100% 99% 100%  Weight:      Height:        Intake/Output Summary (Last 24 hours) at 06/22/2020 1256 Last data filed at 06/22/2020 0900 Gross per  24 hour  Intake 2474.94 ml  Output 2600 ml  Net -125.06 ml   Filed Weights   06/19/20 0209 06/19/20 0211  Weight: 86.2 kg 86.2 kg    Examination:  General exam: NAD Respiratory system: Lungs clear to auscultation bilaterally.  No wheezes, no crackles, no rhonchi.  Normal respiratory effort.  Cardiovascular system: RRR no murmurs rubs or gallops.  No JVD.  No lower extremity edema.  Gastrointestinal system: Abdomen is soft, nontender, nondistended, positive bowel sounds.  No rebound.  No guarding.  Central nervous system: Alert and oriented. No focal neurological deficits. Extremities: Symmetric 5 x 5 power. Skin: No rashes, lesions or ulcers Psychiatry: Judgement and insight appear fair. Mood & affect appropriate.     Data Reviewed: I have personally reviewed following labs and imaging studies  CBC: Recent Labs  Lab 06/18/20 2041 06/19/20 1644 06/20/20 0601 06/21/20 0545 06/22/20 0500  WBC 12.7* 10.7* 9.5 6.7 5.3  NEUTROABS  10.7*  --   --  4.9  --   HGB 13.9 13.9 14.1 14.5 13.9  HCT 42.2 42.3 43.7 45.9 43.4  MCV 85.9 86.3 87.8 88.6 87.3  PLT 211 219 208 240 277    Basic Metabolic Panel: Recent Labs  Lab 06/18/20 2041 06/19/20 1644 06/19/20 1650 06/20/20 0601 06/21/20 0545 06/22/20 0500  NA 130*  --  135 136 137 139  K 3.7  --  3.8 3.0* 3.2* 3.4*  CL 98  --  97* 103 105 106  CO2 22  --  $R'26 23 23 23  'ip$ GLUCOSE 123*  --  89 97 86 92  BUN 14  --  $R'15 14 13 11  'ru$ CREATININE 1.11  --  1.16 0.98 1.06 1.07  CALCIUM 8.5*  --  8.9 8.4* 8.7* 8.8*  MG  --  2.4  --   --  2.4  --   PHOS  --  2.5  --   --  1.7* 3.0    GFR: Estimated Creatinine Clearance: 70.4 mL/min (by C-G formula based on SCr of 1.07 mg/dL).  Liver Function Tests: Recent Labs  Lab 06/18/20 2041 06/19/20 1650 06/20/20 0601 06/21/20 0545 06/22/20 0500  AST 48* 67* 69* 91* 96*  ALT $Re'23 29 27 'wIh$ 34 38  ALKPHOS 68 67 58 57 61  BILITOT 1.2 1.3* 1.1 0.6 0.6  PROT 7.6 7.9 6.4* 6.5 6.6  ALBUMIN 3.2* 3.1* 2.4* 2.5* 2.5*    CBG: No results for input(s): GLUCAP in the last 168 hours.   Recent Results (from the past 240 hour(s))  Culture, blood (Routine x 2)     Status: None (Preliminary result)   Collection Time: 06/19/20  2:44 AM   Specimen: BLOOD  Result Value Ref Range Status   Specimen Description   Final    BLOOD RIGHT ARM Performed at [facility] [address] [place], [place] [address]    Special Requests   Final    BOTTLES DRAWN AEROBIC AND ANAEROBIC Blood Culture adequate volume Performed at [facility] [address], [place], [place] [address]    Culture   Final    NO GROWTH 3 DAYS Performed at [facility], [place] [address], [place], [place] [address]    Report Status PENDING  Incomplete  Respiratory Panel by RT PCR (Flu A&B, Covid) - Nasopharyngeal Swab     Status: None   Collection Time: 06/19/20  5:29 AM   Specimen: Nasopharyngeal Swab  Result Value Ref Range Status  SARS Coronavirus 2 by RT PCR NEGATIVE NEGATIVE Final    Comment: (NOTE) SARS-CoV-2 target nucleic acids are NOT DETECTED.  The SARS-CoV-2 RNA is generally detectable in upper respiratoy specimens during the acute phase of infection. The lowest concentration of SARS-CoV-2 viral copies this assay can detect is 131 copies/mL. A negative result does not preclude SARS-Cov-2 infection and should not be used as the sole basis for treatment or other patient management decisions. A negative result may occur with  improper specimen collection/handling, submission of specimen other than nasopharyngeal swab, presence of viral mutation(s) within the areas targeted by this assay, and inadequate number of viral copies (<131 copies/mL). A negative result must be combined with clinical observations, patient history, and epidemiological information. The expected result is Negative.  Fact Sheet for Patients:  PinkCheek.be  Fact Sheet for Healthcare Providers:  GravelBags.it  This test is no t yet approved or cleared by the [place] FDA and  has been authorized for detection and/or diagnosis of SARS-CoV-2 by FDA under an Emergency Use Authorization (EUA). This EUA will remain  in effect (meaning this test can be used) for the duration of the COVID-19 declaration under Section 564(b)(1) of the Act, 21 U.S.C. section 360bbb-3(b)(1), unless the authorization is terminated or revoked sooner.     Influenza A by PCR NEGATIVE NEGATIVE Final   Influenza B by PCR NEGATIVE NEGATIVE Final    Comment: (NOTE) The Xpert Xpress SARS-CoV-2/FLU/RSV assay is intended as an aid in  the diagnosis of influenza from Nasopharyngeal swab specimens and  should not be used as a sole basis for treatment. Nasal washings and  aspirates are unacceptable for Xpert Xpress SARS-CoV-2/FLU/RSV  testing.  Fact Sheet for  Patients: PinkCheek.be  Fact Sheet for Healthcare Providers: GravelBags.it  This test is not yet approved or cleared by the [place] FDA and  has been authorized for detection and/or diagnosis of SARS-CoV-2 by  FDA under an Emergency Use Authorization (EUA). This EUA will remain  in effect (meaning this test can be used) for the duration of the  Covid-19 declaration under Section 564(b)(1) of the Act, 21  U.S.C. section 360bbb-3(b)(1), unless the authorization is  terminated or revoked. Performed at [facility], [place] [address] [place], [place] [address]   Urine culture     Status: Abnormal   Collection Time: 06/19/20  8:05 AM   Specimen: Urine, Random  Result Value Ref Range Status   Specimen Description   Final    URINE, RANDOM Performed at [facility] [address], [place], [place] [address]    Special Requests   Final    NONE Performed at [facility], [place] [address], [place], [place] [address]    Culture (A)  Final    <10,000 COLONIES/mL INSIGNIFICANT GROWTH Performed at [facility] [address], [place], [place] [address]    Report Status 06/20/2020 FINAL  Final  Culture, blood (Routine x 2)     Status: None (Preliminary result)   Collection Time: 06/19/20 11:15 AM   Specimen: BLOOD  Result Value Ref Range Status   Specimen Description   Final    BLOOD LEFT ANTECUBITAL Performed at [facility] [address], [place], [place] [address]    Special Requests   Final    BOTTLES DRAWN AEROBIC AND ANAEROBIC Blood Culture adequate volume Performed at [facility] [address] [place], [place] [address]    Culture  Final    NO GROWTH 3 DAYS Performed at [facility], [place] [address], [place], [place] [address]    Report Status PENDING  Incomplete  Culture, Urine     Status: None  (Preliminary result)   Collection Time: 06/21/20  1:50 PM   Specimen: Urine, Clean Catch  Result Value Ref Range Status   Specimen Description   Final    URINE, CLEAN CATCH Performed at [facility], [place] [address], [place], [place] [address]    Special Requests   Final    NONE Performed at [facility], [place] [address], [place], [place] [address]    Culture   Final    NO GROWTH Performed at [facility], [place] [address] [place], [place] [address]    Report Status PENDING  Incomplete         Radiology Studies: No results found.      Scheduled Meds: . aspirin EC  81 mg Oral Daily  . azithromycin  500 mg Oral Daily  . enoxaparin (LOVENOX) injection  40 mg Subcutaneous Q24H  . folic acid  1 mg Oral Daily  . multivitamin with minerals  1 tablet Oral Daily  . oxybutynin  5 mg Oral TID  . rosuvastatin  40 mg Oral Daily  . sodium chloride flush  3 mL Intravenous Q12H  . thiamine  100 mg Oral Daily   Or  . thiamine  100 mg Intravenous Daily   Continuous Infusions: . cefTRIAXone (ROCEPHIN)  IV 2 g (06/22/20 0946)  . chlorproMAZINE (THORAZINE) IV       LOS: 2 days    Time spent: 40 minutes    Irine Seal, MD [facility]   To contact the attending provider between 7A-7P or the covering provider during after hours 7P-7A, please log into the web site [email or web address] and access using universal [place] password for that web site. If you do not have the password, please call the hospital operator.  06/22/2020, 12:56 PM

## 2020-06-22 NOTE — Evaluation (Signed)
Clinical/Bedside Swallow Evaluation Patient Details  Name: Jeremiah Garcia MRN: [id number] Date of Birth: 1946/07/03  Today's Date: 06/22/2020 Time: SLP Start Time (ACUTE ONLY): 1026 SLP Stop Time (ACUTE ONLY): 1111 SLP Time Calculation (min) (ACUTE ONLY): 45 min  Past Medical History:  Past Medical History:  Diagnosis Date  . Carotid artery occlusion March 02, 2010   RIGHT  cea  . CVA (cerebral infarction) 2011   with Left upper extremity weakness  . Hyperlipidemia   . Hypertension   . Stroke [facility]) June 2011   Left side weakness   Past Surgical History:  Past Surgical History:  Procedure Laterality Date  . CAROTID ENDARTERECTOMY  March 02, 2010   RIGHT cea  . HERNIA REPAIR  2009   HPI:  pt is a 74 yo male adm to [facility] with SIRS due to CAP resulting in a presyncopal episode- Pt has h/o ETOH use, CVA with ongoing LUE weakness.  Imaging concerning for LLL pna due to opacity, mild right base opacity, likely ATX and choleithiasis.  Pt reports discomfort in gums with eating/drinking - focused on right lower and a single canine upper right.  Gums appeared inflamed - advised RN.  Swallow eval ordered. Pt denies dysphagia just admits to gum pain with attempts to masticate - though he masticates on both sides.  He denies pnas.   Assessment / Plan / Recommendation Clinical Impression  Pt presents with mild oral dysphagia due to lack of dentition and sore inflamed and erythemic gums.  He easily passed 3 ounce Yale water test.  No indication of aspiration with all po observed including full snack of icecream, graham crackers x4 and water 6 ounces.  Upon exam of oral cavity, he appears with eyrthema worse on right lower than left and some dentition appears to have caries.  Since he only isolates pain to right, encouraged him to "masticate" on left side.  Pt denies issues with transiting food (once masticated), pills or liquids through oropharynx and denies reflux.  Encouraged him to continue soft/thin  diet and assisted pt to call for his meal. SLP Visit Diagnosis: Dysphagia, oral phase (R13.11)    Aspiration Risk    mild    Diet Recommendation Dysphagia 3 (Mech soft);Thin liquid   Liquid Administration via: Cup;Straw Medication Administration: Whole meds with liquid Supervision: Patient able to self feed Compensations: Slow rate;Small sips/bites Postural Changes: Seated upright at 90 degrees;Remain upright for at least 30 minutes after po intake    Other  Recommendations Oral Care Recommendations: Oral care QID   Follow up Recommendations Skilled Nursing facility      Frequency and Duration min 1 x/week  1 week       Prognosis Prognosis for Safe Diet Advancement: Fair      Swallow Study   General Date of Onset: 06/22/20 HPI: pt is a 74 yo male adm to [facility] with SIRS due to CAP resulting in a presyncopal episode- Pt has h/o ETOH use, CVA with ongoing LUE weakness.  Imaging concerning for LLL pna due to opacity, mild right base opacity, likely ATX and choleithiasis.  Pt reports discomfort in gums with eating/drinking - focused on right lower and a single canine upper right.  Gums appeared inflamed - advised RN.  Swallow eval ordered. Pt denies dysphagia just admits to gum pain with attempts to masticate - though he masticates on both sides.  He denies pnas. Type of Study: Bedside Swallow Evaluation Previous Swallow Assessment: none Diet Prior to this Study: Regular;Thin  liquids Temperature Spikes Noted: No Respiratory Status: Room air History of Recent Intubation: No Behavior/Cognition: Alert;Cooperative;Other (Comment);Confused (pt required redirection to stop trying to put on his shoes and to get in bed after getting up on his own) Oral Cavity Assessment: Erythema Oral Care Completed by SLP: No Oral Cavity - Dentition: Poor condition    Oral/Motor/Sensory Function Overall Oral Motor/Sensory Function: Within functional limits   Ice Chips Ice chips: Not tested   Thin  Liquid Thin Liquid: Within functional limits Presentation: Cup Other Comments: 3 ounce Yale test passed without difficulties    Nectar Thick Nectar Thick Liquid: Not tested   Honey Thick Honey Thick Liquid: Not tested   Puree Puree: Within functional limits Presentation: Self Fed;Spoon   Solid     Solid: Impaired Oral Phase Impairments: Impaired mastication;Reduced lingual movement/coordination Other Comments: pt with decreased mastication abilities due to his lack of dentition and inflamed and erythemic gums      Chales Abrahams 06/22/2020,12:09 PM  Rolena Infante, MS [facility] SLP Acute Rehab Services Office [phone]

## 2020-06-22 NOTE — TOC Progression Note (Addendum)
Transition of Care [facility]) - Progression Note    Patient Details  Name: Jeremiah Garcia MRN: [id number] Date of Birth: [birth year]  Transition of [facility] CM/SW Contact  Demika Langenderfer, Olegario Messier, RN Phone Number: 06/22/2020, 1:26 PM  Clinical Narrative:  Spoke to [facility] Care [facility] rep-per dtr's conversation-family had concerns about patient's prior living conditions in his apt prior to admission-states-sleeping on the floor, not able to go to bathroom independently, not enough food. Noted ST-dysphagia 3 diet;OT-HHOT/SNF. Recc PT to re eval for possibly needing a higher level of care.[facility] care will continue to follow.    Expected Discharge Plan: Skilled Nursing Facility Barriers to Discharge: Continued Medical Work up  Expected Discharge Plan and Services Expected Discharge Plan: Skilled Nursing Facility   Discharge Planning Services: CM Consult Post Acute Care Choice:  (transitional housing) Living arrangements for the past 2 months: Homeless Shelter                                       Social Determinants of Health (SDOH) Interventions Housing Interventions: [id number] Referral (Homeless in [place].) Transportation Interventions: [id number] Referral (Transportation-bus pass.) Alcohol Brief Interventions/Follow-up: Alcohol Education  Readmission Risk Interventions No flowsheet data found.

## 2020-06-23 DIAGNOSIS — R6510 Systemic inflammatory response syndrome (SIRS) of non-infectious origin without acute organ dysfunction: Secondary | ICD-10-CM | POA: Diagnosis not present

## 2020-06-23 LAB — COMPREHENSIVE METABOLIC PANEL
ALT: 48 U/L — ABNORMAL HIGH (ref 0–44)
AST: 109 U/L — ABNORMAL HIGH (ref 15–41)
Albumin: 2.8 g/dL — ABNORMAL LOW (ref 3.5–5.0)
Alkaline Phosphatase: 67 U/L (ref 38–126)
Anion gap: 11 (ref 5–15)
BUN: 14 mg/dL (ref 8–23)
CO2: 24 mmol/L (ref 22–32)
Calcium: 8.8 mg/dL — ABNORMAL LOW (ref 8.9–10.3)
Chloride: 105 mmol/L (ref 98–111)
Creatinine, Ser: 1.12 mg/dL (ref 0.61–1.24)
GFR calc non Af Amer: >60 mL/min (ref >60)
Glucose, Bld: 116 mg/dL — ABNORMAL HIGH (ref 70–99)
Potassium: 3.4 mmol/L — ABNORMAL LOW (ref 3.5–5.1)
Sodium: 140 mmol/L (ref 135–145)
Total Bilirubin: 0.8 mg/dL (ref 0.3–1.2)
Total Protein: 7 g/dL (ref 6.5–8.1)

## 2020-06-23 LAB — MAGNESIUM: Magnesium: 2.4 mg/dL (ref 1.7–2.4)

## 2020-06-23 LAB — CBC WITH DIFFERENTIAL/PLATELET
Abs Immature Granulocytes: 0.03 10*3/uL (ref 0.00–0.07)
Basophils Absolute: 0 10*3/uL (ref 0.0–0.1)
Basophils Relative: 0 %
Eosinophils Absolute: 0.2 10*3/uL (ref 0.0–0.5)
Eosinophils Relative: 3 %
HCT: 45.1 % (ref 39.0–52.0)
Hemoglobin: 14.5 g/dL (ref 13.0–17.0)
Immature Granulocytes: 1 %
Lymphocytes Relative: 31 %
Lymphs Abs: 1.5 10*3/uL (ref 0.7–4.0)
MCH: 28.5 pg (ref 26.0–34.0)
MCHC: 32.2 g/dL (ref 30.0–36.0)
MCV: 88.6 fL (ref 80.0–100.0)
Monocytes Absolute: 0.5 10*3/uL (ref 0.1–1.0)
Monocytes Relative: 10 %
Neutro Abs: 2.7 10*3/uL (ref 1.7–7.7)
Neutrophils Relative %: 55 %
Platelets: 264 10*3/uL (ref 150–400)
RBC: 5.09 MIL/uL (ref 4.22–5.81)
RDW: 15.1 % (ref 11.5–15.5)
WBC: 4.9 10*3/uL (ref 4.0–10.5)
nRBC: 0 % (ref 0.0–0.2)

## 2020-06-23 LAB — URINE CULTURE: Culture: NO GROWTH

## 2020-06-23 MED ORDER — OXYBUTYNIN CHLORIDE ER 5 MG PO TB24
15.0000 mg | ORAL_TABLET | Freq: Every day | ORAL | Status: DC
  Administered 2020-06-23 – 2020-06-29 (×7): 15 mg via ORAL
  Filled 2020-06-23 (×7): qty 3

## 2020-06-23 MED ORDER — POTASSIUM CHLORIDE CRYS ER 20 MEQ PO TBCR
40.0000 meq | EXTENDED_RELEASE_TABLET | Freq: Once | ORAL | Status: AC
  Administered 2020-06-23: 40 meq via ORAL
  Filled 2020-06-23: qty 2

## 2020-06-23 NOTE — Evaluation (Signed)
Speech Language Pathology Evaluation Patient Details Name: Jeremiah Garcia MRN: [id number] DOB: 04-21-1946 Today's Date: 06/23/2020 Time: 2330-0762 SLP Time Calculation (min) (ACUTE ONLY): 40 min  Problem List:  Patient Active Problem List   Diagnosis Date Noted  . Community acquired pneumonia   . Bowel and bladder incontinence   . Urinary urgency   . SIRS (systemic inflammatory response syndrome) (HCC) 06/19/2020  . Postural dizziness with presyncope 06/19/2020  . Alcohol abuse 06/19/2020  . 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 EMPHYSEMA 12/09/2010  . ACUTE CYSTITIS 11/16/2010  . COUGH 11/16/2010  . DENTAL CARIES 08/22/2010  . Hyperlipidemia 05/17/2010  . GLUCOSE INTOLERANCE 05/16/2010  . Essential hypertension, benign 05/16/2010  . TOBACCO ABUSE 03/14/2010  . CAROTID ARTERY STENOSIS, 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   Past Medical History:  Past Medical History:  Diagnosis Date  . Carotid artery occlusion March 02, 2010   RIGHT  cea  . CVA (cerebral infarction) 2011   with Left upper extremity weakness  . Hyperlipidemia   . Hypertension   . Stroke [facility]) June 2011   Left side weakness   Past Surgical History:  Past Surgical History:  Procedure Laterality Date  . CAROTID ENDARTERECTOMY  March 02, 2010   RIGHT cea  . HERNIA REPAIR  2009   HPI:  pt is a 74 yo male adm to [facility] with SIRS due to CAP resulting in a presyncopal episode- Pt has h/o ETOH use, CVA with ongoing LUE weakness.  Imaging concerning for LLL pna due to opacity, mild right base opacity, likely ATX and choleithiasis.  Pt reports discomfort in gums with eating/drinking - focused on right lower and a single canine upper right.  Gums appeared inflamed - advised RN.  Swallow eval ordered. Pt denies dysphagia just admits to gum pain with attempts to masticate - though he masticates on both sides.  He denies pnas.   Assessment / Plan / Recommendation Clinical Impression  Pt without  dysarthria or language deficits today.  Portion of SLUMS administered with subsequent scoring 7/16 - areas of difficulties included orientation to DOW, clock drawing *but able to direct SlP where to place numbers - thus ? if vision more than cognitive? and functional math question. BIMS (Brief Interview for Mental Status) completed with pt scoring 14/15 which is [facility] for cognition.  Reviewed findings with pt and importance of pt calling for help instead of getting up independently.    Of note, pt did recall at end of session to wanting menu to order meal.  He did not recall asking SLP to request for help for pt to get back to bed nor concern re: lost wallet and glasses.  Follow up at SNF indicated to assure pt is at maximum rehab capacity to assure able to manage home duties, bills, medications, appts, etc.  Pt agreeable to plan.    SLP Assessment  SLP Recommendation/Assessment: All further Speech Lanaguage Pathology  needs can be addressed in the next venue of care SLP Visit Diagnosis: Attention and concentration deficit    Follow Up Recommendations  Skilled Nursing facility    Frequency and Duration   n/a        SLP Evaluation Cognition  Overall Cognitive Status: No family/caregiver present to determine baseline cognitive functioning Arousal/Alertness: Awake/alert Orientation Level: Oriented to person;Oriented to place;Oriented to time;Oriented to situation (not correct day of week but month and year, states he "got sick") Attention: Sustained Memory: Impaired Memory Impairment: Retrieval deficit Immediate Memory Recall: Sock;Blue;Bed  Memory Recall Sock: Without Cue Memory Recall Blue: Without Cue Memory Recall Bed: With Cue Problem Solving: Impaired Problem Solving Impairment: Verbal complex (pt did not ask for assist when conducting clock drawing, states he needs his glasses- pt was able to direct SLP to proper placement of numbers on clock but not correct hand placement for  hour) Executive Function:  (pt is not at this level of function) Safety/Judgment: Impaired       Comprehension  Auditory Comprehension Overall Auditory Comprehension: Appears within functional limits for tasks assessed Yes/No Questions: Not tested Commands: Within Functional Limits Conversation: Complex (re: his health care) Visual Recognition/Discrimination Discrimination: Not tested Reading Comprehension Reading Status:  (able to read and understand "call, don't fall sign")    Expression Expression Primary Mode of Expression: Verbal Verbal Expression Overall Verbal Expression: Appears within functional limits for tasks assessed Initiation: No impairment Level of Generative/Spontaneous Verbalization: Conversation Repetition:  (DNT) Naming: Not tested Pragmatics: No impairment Written Expression Dominant Hand: Right Written Expression:  (difficulty drawing clock, did not assess writing)   Oral / Motor  Oral Motor/Sensory Function Overall Oral Motor/Sensory Function: Within functional limits Motor Speech Overall Motor Speech: Appears within functional limits for tasks assessed Respiration: Within functional limits Resonance: Within functional limits Articulation: Within functional limitis Intelligibility: Intelligible Motor Planning: Witnin functional limits Motor Speech Errors: Not applicable   GO                    Chales Abrahams 06/23/2020, 3:18 PM  Rolena Infante, MS [facility] SLP Acute Rehab Services Office [phone]

## 2020-06-23 NOTE — TOC Progression Note (Addendum)
Transition of Care [facility]) - Progression Note    Patient Details  Name: Jeremiah Garcia MRN: [id number] Date of Birth: 23-May-1946  Transition of Care [facility]) CM/SW Contact  Ellwood Steidle, Olegario Messier, RN Phone Number: 06/23/2020, 1:55 PM  Clinical Narrative:  PT recc SNF-patient/dtr agree to SNF-faxed out-await bed offers-SNF will get auth.   3p-bed offers given to dtr on phone-await choice.   Expected Discharge Plan: Skilled Nursing Facility Barriers to Discharge: Insurance Authorization  Expected Discharge Plan and Services Expected Discharge Plan: Skilled Nursing Facility   Discharge Planning Services: CM Consult Post Acute Care Choice:  (transitional housing) Living arrangements for the past 2 months: Homeless Shelter                                       Social Determinants of Health (SDOH) Interventions Housing Interventions: [id number] Referral (Homeless in [place].) Transportation Interventions: [id number] Referral (Transportation-bus pass.) Alcohol Brief Interventions/Follow-up: Alcohol Education  Readmission Risk Interventions No flowsheet data found.

## 2020-06-23 NOTE — Plan of Care (Signed)

## 2020-06-23 NOTE — Progress Notes (Signed)
PROGRESS NOTE    Jeremiah Garcia  VQM:[id number] DOB: [birth year] DOA: 06/19/2020 PCP: Jackie Plum, MD     Brief Narrative:  Jeremiah Garcia is a 74 year old male with past medical history significant of CVA with residual left upper extremity weakness, hypertension, hyperlipidemia, alcohol abuse, tobacco abuse who presented to the hospital with evaluation of presyncopal episode.  He was concerned of having a UTI, urinary incontinence.  He is homeless.  According to the ED, patient was sitting outside under a tent with his friends when he got up and tried to switch chairs when he felt lightheaded and almost fell down to the ground.  He felt lightheaded when he stood up from sitting.  In the emergency department revealed community-acquired pneumonia and patient was started on ceftriaxone and azithromycin.  New events last 24 hours / Subjective: Patient sitting in bed, eating breakfast.  He has no new complaints on examination, denies any pain, shortness of breath, cough.  States that he feels well today.  Assessment & Plan:   Principal Problem:   SIRS (systemic inflammatory response syndrome) (HCC) Active Problems:   Hyperlipidemia   Essential hypertension, benign   Postural dizziness with presyncope   Alcohol abuse   Community acquired pneumonia   Bowel and bladder incontinence   Urinary urgency   Sepsis secondary to CAP -Sepsis present on admission with fever, tachycardia, leukocytosis on presentation -Patient started on azithromycin, Rocephin --> azithromycin, Omnicef  Postural dizziness with presyncope -Resolved after hydration  Alcohol abuse with concern for withdrawal -Continue CIWA protocol   Bowel and bladder incontinence -Improvement after starting Ditropan  Essential hypertension -Lisinopril and Norvasc on hold due to presyncope on admission  History of CVA -Continue Crestor, aspirin  Hypokalemia -Replace, trend  Hepatitis C -Will need outpatient  follow-up  Suspected underlying dementia +/- Parkinson's -Will need outpatient follow-up   DVT prophylaxis:  enoxaparin (LOVENOX) injection 40 mg Start: 06/19/20 1400  Code Status: Full code Family Communication: Updated daughters by phone this afternoon  Disposition Plan:  Status is: Inpatient  Remains inpatient appropriate because:Unsafe d/c plan   Dispo: The patient is from: Home              Anticipated d/c is to: SNF              Anticipated d/c date is: 1 day              Patient currently is medically stable to d/c.  SNF placement pending   Antimicrobials:  Anti-infectives (From admission, onward)   Start     Dose/Rate Route Frequency Ordered Stop   06/22/20 2200  cefdinir (OMNICEF) capsule 300 mg        300 mg Oral Every 12 hours 06/22/20 1934     06/22/20 1200  azithromycin (ZITHROMAX) tablet 500 mg        500 mg Oral Daily 06/22/20 0749 06/25/20 0959   06/20/20 1200  azithromycin (ZITHROMAX) 500 mg in sodium chloride 0.9 % 250 mL IVPB  Status:  Discontinued        500 mg 250 mL/hr over 60 Minutes Intravenous Every 24 hours 06/19/20 1255 06/22/20 0749   06/20/20 1100  cefTRIAXone (ROCEPHIN) 2 g in sodium chloride 0.9 % 100 mL IVPB  Status:  Discontinued        2 g 200 mL/hr over 30 Minutes Intravenous Every 24 hours 06/19/20 1255 06/22/20 1934   06/19/20 1045  cefTRIAXone (ROCEPHIN) 1 g in sodium chloride 0.9 % 100 mL  IVPB        1 g 200 mL/hr over 30 Minutes Intravenous  Once 06/19/20 1031 06/19/20 1146   06/19/20 1045  azithromycin (ZITHROMAX) 500 mg in sodium chloride 0.9 % 250 mL IVPB        500 mg 250 mL/hr over 60 Minutes Intravenous  Once 06/19/20 1031 06/19/20 1325   06/19/20 0745  cefTRIAXone (ROCEPHIN) 1 g in sodium chloride 0.9 % 100 mL IVPB  Status:  Discontinued        1 g 200 mL/hr over 30 Minutes Intravenous  Once 06/19/20 0744 06/19/20 0800        Objective: Vitals:   06/22/20 1522 06/22/20 2049 06/23/20 0516 06/23/20 1226  BP: 124/69  113/66 128/75 102/65  Pulse: 77 71 74 76  Resp: 20 18 16 20   Temp: 97.8 F (36.6 C) 97.8 F (36.6 C) 97.7 F (36.5 C) 97.6 F (36.4 C)  TempSrc: Oral Oral Oral Oral  SpO2: 96% 100% 100% 100%  Weight:      Height:        Intake/Output Summary (Last 24 hours) at 06/23/2020 1316 Last data filed at 06/23/2020 1000 Gross per 24 hour  Intake 240 ml  Output 175 ml  Net 65 ml   Filed Weights   06/19/20 0209 06/19/20 0211  Weight: 86.2 kg 86.2 kg    Examination:  General exam: Appears calm and comfortable  Respiratory system: Clear to auscultation. Respiratory effort normal. No respiratory distress. No conversational dyspnea.  Cardiovascular system: S1 & S2 heard, RRR. No murmurs. No pedal edema. Gastrointestinal system: Abdomen is nondistended, soft and nontender. Normal bowel sounds heard. Central nervous system: Alert and oriented. No focal neurological deficits. Speech clear.  Extremities: Symmetric in appearance  Skin: No rashes, lesions or ulcers on exposed skin  Psychiatry: Judgement and insight appear normal. Mood & affect appropriate.   Data Reviewed: I have personally reviewed following labs and imaging studies  CBC: Recent Labs  Lab 06/18/20 2041 06/18/20 2041 06/19/20 1644 06/20/20 0601 06/21/20 0545 06/22/20 0500 06/23/20 0430  WBC 12.7*   < > 10.7* 9.5 6.7 5.3 4.9  NEUTROABS 10.7*  --   --   --  4.9  --  2.7  HGB 13.9   < > 13.9 14.1 14.5 13.9 14.5  HCT 42.2   < > 42.3 43.7 45.9 43.4 45.1  MCV 85.9   < > 86.3 87.8 88.6 87.3 88.6  PLT 211   < > 219 208 240 243 264   < > = values in this interval not displayed.   Basic Metabolic Panel: Recent Labs  Lab 06/18/20 2041 06/19/20 1644 06/19/20 1650 06/20/20 0601 06/21/20 0545 06/22/20 0500 06/23/20 0430  NA   < >  --  135 136 137 139 140  K   < >  --  3.8 3.0* 3.2* 3.4* 3.4*  CL   < >  --  97* 103 105 106 105  CO2   < >  --  26 23 23 23 24   GLUCOSE   < >  --  89 97 86 92 116*  BUN   < >  --  15 14 13  11 14   CREATININE   < >  --  1.16 0.98 1.06 1.07 1.12  CALCIUM   < >  --  8.9 8.4* 8.7* 8.8* 8.8*  MG  --  2.4  --   --  2.4  --  2.4  PHOS  --  2.5  --   --  1.7* 3.0  --    < > = values in this interval not displayed.   GFR: Estimated Creatinine Clearance: 67.3 mL/min (by C-G formula based on SCr of 1.12 mg/dL). Liver Function Tests: Recent Labs  Lab 06/19/20 1650 06/20/20 0601 06/21/20 0545 06/22/20 0500 06/23/20 0430  AST 67* 69* 91* 96* 109*  ALT 29 27 34 38 48*  ALKPHOS 67 58 57 61 67  BILITOT 1.3* 1.1 0.6 0.6 0.8  PROT 7.9 6.4* 6.5 6.6 7.0  ALBUMIN 3.1* 2.4* 2.5* 2.5* 2.8*   No results for input(s): LIPASE, AMYLASE in the last 168 hours. Recent Labs  Lab 06/19/20 1152  AMMONIA 13   Coagulation Profile: Recent Labs  Lab 06/19/20 1644  INR 1.0   Cardiac Enzymes: No results for input(s): CKTOTAL, CKMB, CKMBINDEX, TROPONINI in the last 168 hours. BNP (last 3 results) No results for input(s): PROBNP in the last 8760 hours. HbA1C: No results for input(s): HGBA1C in the last 72 hours. CBG: No results for input(s): GLUCAP in the last 168 hours. Lipid Profile: No results for input(s): CHOL, HDL, LDLCALC, TRIG, CHOLHDL, LDLDIRECT in the last 72 hours. Thyroid Function Tests: No results for input(s): TSH, T4TOTAL, FREET4, T3FREE, THYROIDAB in the last 72 hours. Anemia Panel: Recent Labs    06/21/20 0545  VITAMINB12 486   Sepsis Labs: Recent Labs  Lab 06/18/20 2041 06/19/20 0245 06/19/20 1730  PROCALCITON  --   --  0.19  LATICACIDVEN 1.7 1.6  --     Recent Results (from the past 240 hour(s))  Culture, blood (Routine x 2)     Status: None (Preliminary result)   Collection Time: 06/19/20  2:44 AM   Specimen: BLOOD  Result Value Ref Range Status   Specimen Description   Final    BLOOD RIGHT ARM Performed at [facility], [address] [place], [place] [address]    Special Requests   Final    BOTTLES DRAWN AEROBIC AND ANAEROBIC  Blood Culture adequate volume Performed at [facility], [address] [place], [place] [address]    Culture   Final    NO GROWTH 4 DAYS Performed at [facility], [address] [place], [place] [address]    Report Status PENDING  Incomplete  Respiratory Panel by RT PCR (Flu A&B, Covid) - Nasopharyngeal Swab     Status: None   Collection Time: 06/19/20  5:29 AM   Specimen: Nasopharyngeal Swab  Result Value Ref Range Status   SARS Coronavirus 2 by RT PCR NEGATIVE NEGATIVE Final    Comment: (NOTE) SARS-CoV-2 target nucleic acids are NOT DETECTED.  The SARS-CoV-2 RNA is generally detectable in upper respiratoy specimens during the acute phase of infection. The lowest concentration of SARS-CoV-2 viral copies this assay can detect is 131 copies/mL. A negative result does not preclude SARS-Cov-2 infection and should not be used as the sole basis for treatment or other patient management decisions. A negative result may occur with  improper specimen collection/handling, submission of specimen other than nasopharyngeal swab, presence of viral mutation(s) within the areas targeted by this assay, and inadequate number of viral copies (<131 copies/mL). A negative result must be combined with clinical observations, patient history, and epidemiological information. The expected result is Negative.  Fact Sheet for Patients:  https://www.moore.com/  Fact Sheet for Healthcare Providers:  https://www.young.biz/  This test is no t yet approved or cleared by the [place] FDA and  has been authorized for detection  and/or diagnosis of SARS-CoV-2 by FDA under an Emergency Use Authorization (EUA). This EUA will remain  in effect (meaning this test can be used) for the duration of the COVID-19 declaration under Section 564(b)(1) of the Act, 21 U.S.C. section 360bbb-3(b)(1), unless the authorization is terminated or revoked  sooner.     Influenza A by PCR NEGATIVE NEGATIVE Final   Influenza B by PCR NEGATIVE NEGATIVE Final    Comment: (NOTE) The Xpert Xpress SARS-CoV-2/FLU/RSV assay is intended as an aid in  the diagnosis of influenza from Nasopharyngeal swab specimens and  should not be used as a sole basis for treatment. Nasal washings and  aspirates are unacceptable for Xpert Xpress SARS-CoV-2/FLU/RSV  testing.  Fact Sheet for Patients: https://www.moore.com/  Fact Sheet for Healthcare Providers: https://www.young.biz/  This test is not yet approved or cleared by the [place] FDA and  has been authorized for detection and/or diagnosis of SARS-CoV-2 by  FDA under an Emergency Use Authorization (EUA). This EUA will remain  in effect (meaning this test can be used) for the duration of the  Covid-19 declaration under Section 564(b)(1) of the Act, 21  U.S.C. section 360bbb-3(b)(1), unless the authorization is  terminated or revoked. Performed at [facility], [address], [place], [place] [address]   Urine culture     Status: Abnormal   Collection Time: 06/19/20  8:05 AM   Specimen: Urine, Random  Result Value Ref Range Status   Specimen Description   Final    URINE, RANDOM Performed at [facility], [address], [place], [place] [address]    Special Requests   Final    NONE Performed at [facility], [address] [place], [place] [address]    Culture (A)  Final    <10,000 COLONIES/mL INSIGNIFICANT GROWTH Performed at [facility], [address], [place], [place] [address]    Report Status 06/20/2020 FINAL  Final  Culture, blood (Routine x 2)     Status: None (Preliminary result)   Collection Time: 06/19/20 11:15 AM   Specimen: BLOOD  Result Value Ref Range Status   Specimen Description   Final    BLOOD LEFT ANTECUBITAL Performed at [facility], [address], [place], [place] [address]    Special Requests   Final    BOTTLES DRAWN AEROBIC AND ANAEROBIC Blood Culture adequate volume Performed at [facility], [address], [place], [place] [address]    Culture   Final    NO GROWTH 4 DAYS Performed at [facility], [address], [place], [place] [address]    Report Status PENDING  Incomplete  Culture, Urine     Status: None (Preliminary result)   Collection Time: 06/21/20  1:50 PM   Specimen: Urine, Clean Catch  Result Value Ref Range Status   Specimen Description   Final    URINE, CLEAN CATCH Performed at [facility], [address], [place], [place] [address]    Special Requests   Final    NONE Performed at [facility], [address], [place], [place] [address]    Culture   Final    NO GROWTH Performed at [facility], [address], [place], [place] [address]    Report Status PENDING  Incomplete      Radiology Studies: No results found.    Scheduled Meds: . aspirin EC  81 mg Oral Daily  .  azithromycin  500 mg Oral Daily  . cefdinir  300 mg Oral Q12H  . enoxaparin (LOVENOX) injection  40 mg Subcutaneous Q24H  . folic acid  1 mg Oral Daily  . magic mouthwash w/lidocaine  15 mL Oral QID  . multivitamin with minerals  1 tablet Oral Daily  . nicotine  14 mg Transdermal Daily  . oxybutynin  5 mg Oral TID  . rosuvastatin  40 mg Oral Daily  . sodium chloride flush  3 mL Intravenous Q12H  . thiamine  100 mg Oral Daily   Or  . thiamine  100 mg Intravenous Daily   Continuous Infusions: . chlorproMAZINE (THORAZINE) IV       LOS: 3 days      Time spent: 40 minutes   Noralee Stain, DO [facility] 06/23/2020, 1:16 PM   Available via Epic secure chat 7am-7pm After these hours, please refer to coverage provider listed on [email or web address]

## 2020-06-23 NOTE — [place] FL2 (Signed)
[place] MEDICAID FL2 LEVEL OF CARE SCREENING TOOL     IDENTIFICATION  Patient Name: Jeremiah Garcia Birthdate: 01-May-1946 Sex: male Admission Date (Current Location): 06/19/2020  [facility] and [place] Number:  Producer, television/film/video and Address:  [facility],  [address] [place], [place] [address]      Provider Number: [id number]  Attending Physician Name and Address:  Noralee Stain, DO  Relative Name and Phone Number:  Mahalia Longest dtr [phone]    Current Level of Care: Hospital Recommended Level of Care: Skilled Nursing Facility Prior Approval Number:    Date Approved/Denied:   PASRR Number: [id number]  Discharge Plan: SNF    Current Diagnoses: Patient Active Problem List   Diagnosis Date Noted  . Community acquired pneumonia   . Bowel and bladder incontinence   . Urinary urgency   . SIRS (systemic inflammatory response syndrome) (HCC) 06/19/2020  . Postural dizziness with presyncope 06/19/2020  . Alcohol abuse 06/19/2020  . 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 EMPHYSEMA 12/09/2010  . ACUTE CYSTITIS 11/16/2010  . COUGH 11/16/2010  . DENTAL CARIES 08/22/2010  . Hyperlipidemia 05/17/2010  . GLUCOSE INTOLERANCE 05/16/2010  . Essential hypertension, benign 05/16/2010  . TOBACCO ABUSE 03/14/2010  . CAROTID ARTERY STENOSIS, 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    Orientation RESPIRATION BLADDER Height & Weight     Situation, Place  Normal Continent Weight: 86.2 kg Height:  6\' 2"  (188 cm)  BEHAVIORAL SYMPTOMS/MOOD NEUROLOGICAL BOWEL NUTRITION STATUS      Continent Diet (Regular/thins)  AMBULATORY STATUS COMMUNICATION OF NEEDS Skin   Limited Assist Verbally Normal                       Personal Care Assistance Level of Assistance  Feeding, Dressing   Feeding assistance: Limited assistance       Functional Limitations Info  Hearing, Speech, Sight Sight Info: Adequate Hearing Info: Adequate Speech Info: Adequate     SPECIAL CARE FACTORS FREQUENCY  PT (By licensed PT), OT (By licensed OT)     PT Frequency: 5x week OT Frequency: 5x week            Contractures Contractures Info: Not present    Additional Factors Info  Code Status, Allergies Code Status Info:  (Full code) Allergies Info:  (NKA)           Current Medications (06/23/2020):  This is the current hospital active medication list Current Facility-Administered Medications  Medication Dose Route Frequency Provider Last Rate Last Admin  . acetaminophen (TYLENOL) tablet 650 mg  650 mg Oral Q6H PRN 08/23/2020, MD   650 mg at 06/20/20 1345   Or  . acetaminophen (TYLENOL) suppository 650 mg  650 mg Rectal Q6H PRN 08/20/20, MD   650 mg at 06/19/20 1828  . aspirin EC tablet 81 mg  81 mg Oral Daily 08/19/20, MD   81 mg at 06/23/20 1205  . azithromycin (ZITHROMAX) tablet 500 mg  500 mg Oral Daily 08/23/20, MD   500 mg at 06/23/20 1204  . cefdinir (OMNICEF) capsule 300 mg  300 mg Oral Q12H 08/23/20, MD   300 mg at 06/23/20 1205  . chlorproMAZINE (THORAZINE) 25 mg in sodium chloride 0.9 % 25 mL IVPB  25 mg Intravenous Q6H PRN 08/23/20, MD      . enoxaparin (LOVENOX) injection 40 mg  40 mg Subcutaneous Q24H Rodolph Bong  E, MD   40 mg at 06/22/20 1416  . folic acid (FOLVITE) tablet 1 mg  1 mg Oral Daily Jae Dire, MD   1 mg at 06/23/20 1205  . magic mouthwash w/lidocaine  15 mL Oral QID Rodolph Bong, MD   15 mL at 06/23/20 1207  . metoprolol tartrate (LOPRESSOR) injection 5 mg  5 mg Intravenous Q6H PRN Jae Dire, MD      . multivitamin with minerals tablet 1 tablet  1 tablet Oral Daily Jae Dire, MD   1 tablet at 06/23/20 1204  . nicotine (NICODERM CQ - dosed in mg/24 hours) patch 14 mg  14 mg Transdermal Daily Marikay Alar, FNP   14 mg at 06/23/20 1202  . oxybutynin (DITROPAN) tablet 5 mg  5 mg Oral TID Rodolph Bong, MD   5 mg at 06/23/20 1205  . rosuvastatin  (CRESTOR) tablet 40 mg  40 mg Oral Daily Jae Dire, MD   40 mg at 06/23/20 1206  . sodium chloride flush (NS) 0.9 % injection 3 mL  3 mL Intravenous Q12H Jae Dire, MD   3 mL at 06/23/20 1215  . thiamine tablet 100 mg  100 mg Oral Daily Jae Dire, MD   100 mg at 06/23/20 1206   Or  . thiamine (B-1) injection 100 mg  100 mg Intravenous Daily Jae Dire, MD         Discharge Medications: Please see discharge summary for a list of discharge medications.  Relevant Imaging Results:  Relevant Lab Results:   Additional Information ss#[id number]  Teiana Hajduk, Olegario Messier, RN

## 2020-06-23 NOTE — TOC Transition Note (Signed)
Transition of Care [facility]) - CM/SW Discharge Note   Patient Details  Name: Jeremiah Garcia MRN: [id number] Date of Birth: [birth year]  [facility]) CM/SW Contact:  Lanier Clam, RN Phone Number: 06/23/2020, 10:35 AM   Clinical Narrative: Patient will d/c to hotel-referral for [facility] placed-they will eval to acceptance. Patient in agreement.      Final next level of care: Other (comment) [facility]) Barriers to Discharge: No Barriers Identified   Patient Goals and CMS Choice Patient states their goals for this hospitalization and ongoing recovery are:: Media planner recovery crisis center [employer].gov Compare Post Acute Care list provided to:: Patient Choice offered to / list presented to : Patient  Discharge Placement                       Discharge Plan and Services   Discharge Planning Services: CM Consult Post Acute Care Choice:  (transitional housing)                               Social Determinants of Health (SDOH) Interventions Housing Interventions: [id number] Referral (Homeless in [place].) Transportation Interventions: [id number] Referral (Transportation-bus pass.) Alcohol Brief Interventions/Follow-up: Alcohol Education   Readmission Risk Interventions No flowsheet data found.

## 2020-06-23 NOTE — Progress Notes (Signed)
Physical Therapy Treatment Patient Details Name: Jeremiah Garcia MRN: [id number] DOB: 24-Feb-1946 Today's Date: 06/23/2020    History of Present Illness Pt admitted with fever and urinary frequency and now with dx of SIRS vs sepsis.  Pt with hx of CVA, ETOH abuse and tremor    PT Comments    Pt required increased assistance on today. Min assist for mobility overall. LOB x 2 during session today. Pt required assist from therapist to prevent fall x 2. Slowed balance reactions noted. Pt is at risk for falls when mobilizing, especially so if mobilizing unassisted. Discussed PT recommendations-he agrees that he needs ST rehab to improve strength and balance and to regain PLOF/independence. Will continue to follow.     Follow Up Recommendations  SNF     Equipment Recommendations  Rolling walker with 5" wheels    Recommendations for Other Services       Precautions / Restrictions Precautions Precautions: Fall Restrictions Weight Bearing Restrictions: No    Mobility  Bed Mobility Overal bed mobility: Needs Assistance Bed Mobility: Supine to Sit     Supine to sit: Min guard;HOB elevated Sit to supine: Min guard;HOB elevated   General bed mobility comments: Increased time. Cues required.  Transfers Overall transfer level: Needs assistance Equipment used: Rolling walker (2 wheeled) Transfers: Sit to/from Stand Sit to Stand: Min assist         General transfer comment: Pt was unable to stand from standard sitting surface height without assistance. Assist to power up, stabilize, and control descent. Poor eccentric control especially to lower sitting surfaces. Cues for safety, technique, hand placement.  Ambulation/Gait Ambulation/Gait assistance: Min assist Gait Distance (Feet): 50 Feet Assistive device: Rolling walker (2 wheeled) Gait Pattern/deviations: Step-through pattern;Decreased stride length     General Gait Details: LOB x 2-assist from therapist required to  prevent fall. Slowed balance reactions. Cues for safety, proper use of RW. Slow gait speed. Pt denied dizziness.   Stairs             Wheelchair Mobility    Modified Rankin (Stroke Patients Only)       Balance Overall balance assessment: Needs assistance           Standing balance-Leahy Scale: Poor Standing balance comment: LOB x 2 while maneuvering in tight bathroom space with RW. Required assist from therapist to prevent fall x 2. Slowed balance reaction.                            Cognition Arousal/Alertness: Awake/alert Behavior During Therapy: WFL for tasks assessed/performed Overall Cognitive Status: No family/caregiver present to determine baseline cognitive functioning                                 General Comments: Very pleasant and cooperative. Increased time to process on today.      Exercises      General Comments        Pertinent Vitals/Pain Pain Assessment: No/denies pain    Home Living                      Prior Function            PT Goals (current goals can now be found in the care plan section) Progress towards PT goals: Progressing toward goals    Frequency    Min 3X/week  PT Plan Discharge plan needs to be updated    Co-evaluation              AM-PAC PT "6 Clicks" Mobility   Outcome Measure  Help needed turning from your back to your side while in a flat bed without using bedrails?: None Help needed moving from lying on your back to sitting on the side of a flat bed without using bedrails?: None Help needed moving to and from a bed to a chair (including a wheelchair)?: A Little Help needed standing up from a chair using your arms (e.g., wheelchair or bedside chair)?: A Little Help needed to walk in hospital room?: A Little Help needed climbing 3-5 steps with a railing? : A Lot 6 Click Score: 19    End of Session Equipment Utilized During Treatment: Gait belt Activity  Tolerance: Patient tolerated treatment well Patient left: in chair;with call bell/phone within reach;with chair alarm set   PT Visit Diagnosis: Difficulty in walking, not elsewhere classified (R26.2);Muscle weakness (generalized) (M62.81);Unsteadiness on feet (R26.81)     Time: 1505-6979 PT Time Calculation (min) (ACUTE ONLY): 16 min  Charges:  $Gait Training: 8-22 mins                         Faye Ramsay, PT Acute Rehabilitation  Office: [phone] Pager: [phone]

## 2020-06-23 NOTE — TOC Progression Note (Signed)
Transition of Care [facility]) - Progression Note    Patient Details  Name: Jeremiah Garcia MRN: [id number] Date of Birth: Sep 20, 1945  [facility]) CM/SW Contact  Kyah Buesing, Olegario Messier, RN Phone Number: 06/23/2020, 3:07 PM  Clinical Narrative:    1.  1.4 mi     [facility]                   [address]  [place], [place] [address]  [phone]     Overall rating    Average      2.  1.8 mi     [facility]                   [address]  [place], [place] [address]  [phone]     Overall rating    Below average      3.  1.8 mi     [facility] [place] [facility]                   [address]  [place], [place] [address]  [phone]     Overall rating    Below average      4.  2.5 mi     [address] at [place], [place]                   [address] [place], [place] [address]  [phone]     Overall rating    Much below average      5.  2.9 mi     [facility] [address]                   [address]  [place], [place] [address]  [phone]     Overall rating    Much above average      6.  3.9 mi     [facility]                   [address]  [place], [place] [address]  [phone]     Overall rating    Much above average      7.  4.3 mi     [facility]                   [address]  [place], [place] [address]  [phone]     Overall rating    Below average      8.  4.5 mi     [facility]                   [address] [place], [place] [address]  [phone]     Overall rating     Much above average      9.  4.5 mi     [facility]                   [address] [place], [place] [address]  [phone]     Overall rating    Much above average      10.  5 mi     [facility]  [address]  [place], [place] [address]  [phone]     Overall rating    Below average      11.  5.1 mi     [facility]                   [address]  [place], [place] [address]  [phone]     Overall rating    Below average      12.  5.3 mi     [facility]                   [address]  [place], [place] [address]  [phone]     Overall rating    Below average      13.  7.3 mi     [facility]                   [address]  [place], [place] [address]  [phone]     Overall rating    Average      14.  8.6 mi     [facility]                   [address]  [place], [place] [address]  [phone]     Overall rating    Much below average      15.  10.2 [facility]                   [address]  [place], [place] [address]  [phone]     Overall rating    Above average      16.  10.9 mi     [facility]                   [address]  [place], [place] [address]  [phone]     Overall rating    Much above average      17.  10.9 mi     [facility]                   [address] [place], [place] [address]  [phone]     Overall rating    Average      18.  11.2  mi     [facility]                   [address]  [place], [place] [address]  [phone]     Overall rating    Much above average      19.  12.7 mi     [facility]                   [address]  [place], [place] [address]  [phone]     Overall rating    Much below average      20.  14.2 mi     [facility]                   [address]  [place], [place] [address]  [phone]     Overall rating    Average  21.  14.3 mi     [facility]                   [address]  [place], [place] [address]  [phone]     Overall rating    Much below average      22.  14.6 mi     [facility]                   [address]  [place], [place] [address]  [phone]     Overall rating    Below average      23.  14.9 mi     [facility]                   [address]  [place], [place] [address]  [phone]     Overall rating    Much below average      24.  15.9 mi     [facility]                   [address]  [place], [place] [address]  [phone]     Overall rating    Below average      25.  16.5 mi     [facility]                   [address]  [place], [place] [address]  [phone]     Overall rating    Much below average      26.  16.6 mi     [facility]                   [address]  [place], [place] [address]  [phone]     Overall rating    Much above average      27.  16.8 mi     [facility]                   [address]  [place], [place] [address]  [phone]     Overall rating    Much above average      28.  17.8 mi     [facility]                   [address]  [place], [place] [address]  [phone]     Overall rating    Average      29.  18.6 mi     [facility]                   [address]  [place], [place] [address]  [phone]     Overall rating    Average      30.  19.3 [facility]     [facility]                   [address]  [place], [place] [address]  [phone]     Overall rating    Below average      31.  19.5 mi     [facility]  [address]  [place], [place] [address]  [phone]     Overall rating    Much above average      32.  19.9 mi     [facility] at [employer] [facility], [place] [address]  [phone]     Overall rating    Much above average      33.  19.9 [facility]                   [address]  [place], [place] [address]  [phone]     Overall rating    Much below average      34.  21.4 mi     [facility]                   [address]  [place], [place] [address]  [phone]     Overall rating    Below average      35.  [address]                   [address]  [place], [place] [address]  [phone]     Overall rating    Average      36.  22.4 mi     [facility]                   [address]  [place], [place] [address]  [phone]     Overall rating    Below average      37.  22.7 mi     [facility]                   [address]  [place], [place] [address]  [phone]     Overall rating    Below average      38.  23.3 mi     [facility]                   [address]  [place], [place] [address]  [phone]     Overall rating    Above average      39.  23.3 [facility]                   [address]  [place], [place] [address]  [phone]     Overall rating    Below average      40.  23.9 [address]                   [address]  [place], [place] [address]  [phone]     Overall rating    Much above average      41.  25 mi     [facility]                   [address]  [place], [place] [address]  [phone]     Expected Discharge Plan: Skilled Nursing Facility Barriers to Discharge: Insurance Authorization  Expected  Discharge Plan and Services Expected Discharge Plan: Skilled Nursing Facility   Discharge Planning Services: CM Consult Post Acute Care Choice:  (transitional housing) Living arrangements for the past 2 months: Homeless Shelter                                       Social Determinants of Health (SDOH) Interventions Housing Interventions: [id number] Referral (Homeless in [place].) Transportation Interventions: [id number] Referral (Transportation-bus pass.) Alcohol Brief Interventions/Follow-up: Alcohol Education  Readmission Risk Interventions No flowsheet data found.

## 2020-06-24 ENCOUNTER — Encounter (HOSPITAL_COMMUNITY): Payer: Self-pay | Admitting: Internal Medicine

## 2020-06-24 LAB — BASIC METABOLIC PANEL
Anion gap: 10 (ref 5–15)
BUN: 13 mg/dL (ref 8–23)
CO2: 22 mmol/L (ref 22–32)
Calcium: 8.7 mg/dL — ABNORMAL LOW (ref 8.9–10.3)
Chloride: 107 mmol/L (ref 98–111)
Creatinine, Ser: 0.91 mg/dL (ref 0.61–1.24)
GFR calc non Af Amer: >60 mL/min (ref >60)
Glucose, Bld: 101 mg/dL — ABNORMAL HIGH (ref 70–99)
Potassium: 3.5 mmol/L (ref 3.5–5.1)
Sodium: 139 mmol/L (ref 135–145)

## 2020-06-24 LAB — CULTURE, BLOOD (ROUTINE X 2)
Culture: NO GROWTH
Culture: NO GROWTH
Special Requests: ADEQUATE
Special Requests: ADEQUATE

## 2020-06-24 LAB — HCV RNA QUANT
HCV Quantitative Log: 6.616 log10 IU/mL (ref >1.70)
HCV Quantitative: 4130000 IU/mL (ref >50)

## 2020-06-24 LAB — RESPIRATORY PANEL BY RT PCR (FLU A&B, COVID)
Influenza A by PCR: NEGATIVE
Influenza B by PCR: NEGATIVE
SARS Coronavirus 2 by RT PCR: NEGATIVE

## 2020-06-24 LAB — MAGNESIUM: Magnesium: 2.4 mg/dL (ref 1.7–2.4)

## 2020-06-24 MED ORDER — OXYBUTYNIN CHLORIDE ER 15 MG PO TB24: 15.0000 mg | ORAL_TABLET | Freq: Every day | ORAL | 2 refills | Status: AC

## 2020-06-24 MED ORDER — POTASSIUM CHLORIDE CRYS ER 20 MEQ PO TBCR
40.0000 meq | EXTENDED_RELEASE_TABLET | Freq: Once | ORAL | Status: AC
  Administered 2020-06-24: 40 meq via ORAL
  Filled 2020-06-24: qty 2

## 2020-06-24 MED ORDER — THIAMINE HCL 100 MG PO TABS: 100.0000 mg | ORAL_TABLET | Freq: Every day | ORAL | 2 refills | Status: AC

## 2020-06-24 MED ORDER — ADULT MULTIVITAMIN W/MINERALS CH: 1.0000 | ORAL_TABLET | Freq: Every day | ORAL | Status: AC

## 2020-06-24 MED ORDER — FOLIC ACID 1 MG PO TABS: 1.0000 mg | ORAL_TABLET | Freq: Every day | ORAL | 2 refills | Status: AC

## 2020-06-24 NOTE — Discharge Summary (Addendum)
Physician Discharge Summary  Jeremiah Garcia EXB:[id number] DOB: 1945/11/23 DOA: 06/19/2020  PCP: Jackie Plum, MD  Admit date: 06/19/2020 Discharge date: 06/29/2020  Admitted From: Home/homeless Disposition:  Same. Insurance auth denied SNF placement.   Recommendations for Outpatient Follow-up:  1. Follow up with PCP in 1 week 2. Patient will need outpatient follow-up and work-up regarding hepatitis C as well as suspected underlying dementia and/or Parkinson's  Discharge Condition: Stable CODE STATUS: Full  Diet recommendation:  Diet Orders (From admission, onward)    Start     Ordered   06/21/20 1807  Diet regular Room service appropriate? Yes with Assist; Fluid consistency: Thin  Diet effective now       Comments: PLEASE ORDER SOFTER FOODS.  Question Answer Comment  Room service appropriate? 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  He was concerned of having a UTI, urinary incontinence.  He is homeless.  According to the ED, patient was sitting outside under a tent with his friends when he got up and tried to switch chairs when he felt lightheaded and almost fell down to the ground.  He felt lightheaded when he stood up from sitting.  In the emergency department revealed community-acquired pneumonia and patient was started on ceftriaxone and azithromycin.  Discharge Diagnoses:  Principal Problem:   Sepsis (HCC) Active Problems:   Hyperlipidemia   Essential hypertension, benign   Postural dizziness with presyncope   Alcohol abuse   Community acquired pneumonia   Bowel and bladder incontinence   Urinary urgency   Sepsis secondary to CAP -Sepsis present on  admission with fever, tachycardia, leukocytosis on presentation -Patient completed antibiotics during hospitalization  Postural dizziness with presyncope -Resolved after hydration  Alcohol abuse with concern for withdrawal -No sign of withdrawal on examination   Bowel and bladder incontinence -Improvement after starting Ditropan  Essential hypertension -Lisinopril and Norvasc on hold due to presyncope on admission  History of CVA -Continue Crestor, aspirin  Hepatitis C -Will need outpatient follow-up  Suspected underlying dementia +/- Parkinson's -Will need outpatient follow-up    Discharge Instructions  Discharge Instructions    Increase activity slowly   Complete by: As directed      Allergies as of 06/29/2020   No Known Allergies     Medication List    STOP taking these medications   amLODipine 5 MG tablet Commonly known as: NORVASC   lisinopril 40 MG tablet Commonly known as: ZESTRIL   tadalafil 20 MG tablet Commonly known as: Cialis     TAKE these medications   aspirin EC 81 MG tablet Take 81 mg by mouth daily.   fish oil-omega-3 fatty acids 1000 MG capsule Take 1 g by mouth 2 (two) times daily.   folic acid 1 MG tablet Commonly known as: FOLVITE Take 1 tablet (1 mg total) by mouth daily.   lidocaine 2 % solution Commonly known as: XYLOCAINE Use as directed 15 mLs in the mouth or throat as needed for mouth pain.   multivitamin with minerals Tabs tablet Take 1 tablet by mouth daily.   oxybutynin 15 MG 24 hr tablet Commonly known as: DITROPAN XL Take 1 tablet (15 mg total) by mouth at  bedtime.   rosuvastatin 40 MG tablet Commonly known as: CRESTOR Take 1 tablet (40 mg total) by mouth daily.   thiamine 100 MG tablet Take 1 tablet (100 mg total) by mouth daily.       Contact information for follow-up providers    [facility]. Call.   Why: call and make an appointment on Monday Contact  information: [address] [place] [address] [phone]       [facility]. Call.   Why: [phone] Contact information: A service that provides assistance to navigate through indepent and assisted living facilities.       Loss adjuster, chartered. Go to.   Why: go directly there @ d/c. M-F 8a-3p Contact information: [address] [place] [address] [phone]           Contact information for after-discharge care    Destination    [facility] .   Service: Skilled Nursing Contact information: [address] [place] [address] [phone]                 No Known Allergies     Procedures/Studies: CT ABDOMEN PELVIS WO CONTRAST  Result Date: 06/19/2020 CLINICAL DATA:  Flank pain, urinary frequency EXAM: CT ABDOMEN AND PELVIS WITHOUT CONTRAST TECHNIQUE: Multidetector CT imaging of the abdomen and pelvis was performed following the standard protocol without IV contrast. COMPARISON:  10/30/2013 FINDINGS: Lower chest: Patchy left lower lobe opacity, suspicious for pneumonia. Mild right basilar opacity, atelectasis versus pneumonia. Hepatobiliary: Unenhanced liver is unremarkable. Layering small gallstones. No gallbladder wall thickening or pericholecystic fluid. No intrahepatic or extrahepatic ductal dilatation. Pancreas: Within normal limits. Spleen: Within normal limits. Adrenals/Urinary Tract: Adrenal glands are within normal limits. Kidneys are within normal limits. No renal, ureteral, or bladder calculi. No hydronephrosis. Irregularly thick-walled bladder (series 2/image 66), although underdistended. Stomach/Bowel: Stomach is within normal limits. No evidence of bowel obstruction. Appendix is not discretely visualized. Colonic diverticulosis, without evidence of diverticulitis. Vascular/Lymphatic: No evidence of abdominal aortic aneurysm. Atherosclerotic calcifications of the abdominal aorta and branch  vessels. No suspicious abdominopelvic lymphadenopathy. Reproductive: Prostate is unremarkable. Other: No abdominopelvic ascites. Musculoskeletal: Mild degenerative changes of the visualized thoracolumbar spine, most prominent at L5-S1. IMPRESSION: Irregularly thick-walled bladder, although underdistended. Correlate for cystitis. No renal, ureteral, or bladder calculi. No hydronephrosis. Patchy left lower lobe opacity, suspicious for pneumonia. Mild right basilar opacity, likely atelectasis. Cholelithiasis, without evidence of acute cholecystitis. Electronically Signed   By: Charline Bills M.D.   On: 06/19/2020 12:41   DG Chest 2 View  Result Date: 06/19/2020 CLINICAL DATA:  Suspected sepsis EXAM: CHEST - 2 VIEW COMPARISON:  October 12, 2012 FINDINGS: The heart size and mediastinal contours are within normal limits. Patchy airspace opacity seen within the retrocardiac region. The right lung is clear. No pleural effusion is seen. The visualized skeletal structures are unremarkable. IMPRESSION: Patchy retrocardiac opacity which could be due to atelectasis and/or infectious etiology. Electronically Signed   By: Jonna Clark M.D.   On: 06/19/2020 03:31       Discharge Exam: Vitals:   06/28/20 2127 06/29/20 0524  BP: 113/64 106/67  Pulse: 67 70  Resp: 18 17  Temp: 98.2 F (36.8 C) 98 F (36.7 C)  SpO2: 100% 98%    General: Pt is alert, awake, not in acute distress Cardiovascular: RRR, S1/S2 +, no edema Respiratory: CTA bilaterally, no wheezing, no rhonchi, no respiratory distress, no conversational dyspnea, on  room air Abdominal: Soft, NT, ND, bowel sounds + Extremities: no edema, no cyanosis Psych: Normal mood and affect, stable judgement and insight     The results of significant diagnostics from this hospitalization (including imaging, microbiology, ancillary and laboratory) are listed below for reference.     Microbiology: Recent Results (from the past 240 hour(s))  Culture, blood  (Routine x 2)     Status: None   Collection Time: 06/19/20 11:15 AM   Specimen: BLOOD  Result Value Ref Range Status   Specimen Description   Final    BLOOD LEFT ANTECUBITAL Performed at [facility], [address], [place], [place] [address]    Special Requests   Final    BOTTLES DRAWN AEROBIC AND ANAEROBIC Blood Culture adequate volume Performed at [facility], [address] [place], [place] [address]    Culture   Final    NO GROWTH 5 DAYS Performed at [facility], [address], [place], [place] [address]    Report Status 06/24/2020 FINAL  Final  Culture, Urine     Status: None   Collection Time: 06/21/20  1:50 PM   Specimen: Urine, Clean Catch  Result Value Ref Range Status   Specimen Description   Final    URINE, CLEAN CATCH Performed at [facility], [address], [place], [place] [address]    Special Requests   Final    NONE Performed at [facility], [address], [place], [place] [address]    Culture   Final    NO GROWTH Performed at [facility], [address], [place], [place] [address]    Report Status 06/23/2020 FINAL  Final  Respiratory Panel by RT PCR (Flu A&B, Covid) - Nasopharyngeal Swab     Status: None   Collection Time: 06/24/20 10:46 AM   Specimen: Nasopharyngeal Swab  Result Value Ref Range Status   SARS Coronavirus 2 by RT PCR NEGATIVE NEGATIVE Final    Comment: (NOTE) SARS-CoV-2 target nucleic acids are NOT DETECTED.  The SARS-CoV-2 RNA is generally detectable in upper respiratoy specimens during the acute phase of infection. The lowest concentration of SARS-CoV-2 viral copies this assay can detect is 131 copies/mL. A negative result does not preclude SARS-Cov-2 infection and should not be used as the sole basis for treatment or other patient management decisions. A negative result may occur with  improper specimen collection/handling, submission  of specimen other than nasopharyngeal swab, presence of viral mutation(s) within the areas targeted by this assay, and inadequate number of viral copies (<131 copies/mL). A negative result must be combined with clinical observations, patient history, and epidemiological information. The expected result is Negative.  Fact Sheet for Patients:  https://www.moore.com/  Fact Sheet for Healthcare Providers:  https://www.young.biz/  This test is no t yet approved or cleared by the [place] FDA and  has been authorized for detection and/or diagnosis of SARS-CoV-2 by FDA under an Emergency Use Authorization (EUA). This EUA will remain  in effect (meaning this test can be used) for the duration of the COVID-19 declaration under Section 564(b)(1) of the Act, 21 U.S.C. section 360bbb-3(b)(1), unless the authorization is terminated or revoked sooner.     Influenza A by PCR NEGATIVE NEGATIVE Final   Influenza B by PCR NEGATIVE NEGATIVE Final    Comment: (NOTE) The Xpert Xpress SARS-CoV-2/FLU/RSV assay is intended as an aid in  the diagnosis of influenza from Nasopharyngeal swab specimens and  should not be used as a sole basis for treatment. Nasal washings and  aspirates are unacceptable for Xpert Xpress SARS-CoV-2/FLU/RSV  testing.  Fact Sheet for Patients: https://www.moore.com/  Fact Sheet for Healthcare Providers: https://www.young.biz/  This test is not yet approved or cleared by the [place] FDA and  has been authorized for detection and/or diagnosis of SARS-CoV-2 by  FDA under an Emergency Use Authorization (EUA). This EUA will remain  in effect (meaning this test can be used) for the duration of the  Covid-19 declaration under Section 564(b)(1) of the Act, 21  U.S.C. section 360bbb-3(b)(1), unless the authorization is  terminated or revoked. Performed at [facility], [address], [place], [place] [address]      Labs: BNP (last 3 results) No results for input(s): BNP in the last 8760 hours. Basic Metabolic Panel: Recent Labs  Lab 06/23/20 0430 06/24/20 0432  NA 140 139  K 3.4* 3.5  CL 105 107  CO2 24 22  GLUCOSE 116* 101*  BUN 14 13  CREATININE 1.12 0.91  CALCIUM 8.8* 8.7*  MG 2.4 2.4   Liver Function Tests: Recent Labs  Lab 06/23/20 0430  AST 109*  ALT 48*  ALKPHOS 67  BILITOT 0.8  PROT 7.0  ALBUMIN 2.8*   No results for input(s): LIPASE, AMYLASE in the last 168 hours. No results for input(s): AMMONIA in the last 168 hours. CBC: Recent Labs  Lab 06/23/20 0430  WBC 4.9  NEUTROABS 2.7  HGB 14.5  HCT 45.1  MCV 88.6  PLT 264   Cardiac Enzymes: No results for input(s): CKTOTAL, CKMB, CKMBINDEX, TROPONINI in the last 168 hours. BNP: Invalid input(s): POCBNP CBG: No results for input(s): GLUCAP in the last 168 hours. D-Dimer No results for input(s): DDIMER in the last 72 hours. Hgb A1c No results for input(s): HGBA1C in the last 72 hours. Lipid Profile No results for input(s): CHOL, HDL, LDLCALC, TRIG, CHOLHDL, LDLDIRECT in the last 72 hours. Thyroid function studies No results for input(s): TSH, T4TOTAL, T3FREE, THYROIDAB in the last 72 hours.  Invalid input(s): FREET3 Anemia work up No results for input(s): VITAMINB12, FOLATE, FERRITIN, TIBC, IRON, RETICCTPCT in the last 72 hours. Urinalysis    Component Value Date/Time   COLORURINE AMBER (A) 06/19/2020 0257   APPEARANCEUR CLOUDY (A) 06/19/2020 0257   LABSPEC 1.016 06/19/2020 0257   PHURINE 5.0 06/19/2020 0257   GLUCOSEU NEGATIVE 06/19/2020 0257   HGBUR LARGE (A) 06/19/2020 0257   HGBUR negative 11/16/2010 1100   BILIRUBINUR NEGATIVE 06/19/2020 0257   KETONESUR 5 (A) 06/19/2020 0257   PROTEINUR >=300 (A) 06/19/2020 0257   UROBILINOGEN 0.2 11/16/2010 1100   NITRITE NEGATIVE 06/19/2020 0257   LEUKOCYTESUR NEGATIVE 06/19/2020 0257   Sepsis Labs Invalid  input(s): PROCALCITONIN,  WBC,  LACTICIDVEN Microbiology Recent Results (from the past 240 hour(s))  Culture, blood (Routine x 2)     Status: None   Collection Time: 06/19/20 11:15 AM   Specimen: BLOOD  Result Value Ref Range Status   Specimen Description   Final    BLOOD LEFT ANTECUBITAL Performed at [facility], [address] [place], [place] [address]    Special Requests   Final    BOTTLES DRAWN AEROBIC AND ANAEROBIC Blood Culture adequate volume Performed at [facility], [address], [place], [place] [address]    Culture   Final    NO GROWTH 5 DAYS Performed at [facility], [address],  [place], [place] [address]    Report Status 06/24/2020 FINAL  Final  Culture, Urine     Status: None   Collection Time: 06/21/20  1:50 PM   Specimen: Urine, Clean Catch  Result Value Ref Range Status   Specimen Description   Final    URINE, CLEAN CATCH Performed at [facility], [address] [place], [place] [address]    Special Requests   Final    NONE Performed at [facility], [address], [place], [place] [address]    Culture   Final    NO GROWTH Performed at [facility], [address] [place], [place] [address]    Report Status 06/23/2020 FINAL  Final  Respiratory Panel by RT PCR (Flu A&B, Covid) - Nasopharyngeal Swab     Status: None   Collection Time: 06/24/20 10:46 AM   Specimen: Nasopharyngeal Swab  Result Value Ref Range Status   SARS Coronavirus 2 by RT PCR NEGATIVE NEGATIVE Final    Comment: (NOTE) SARS-CoV-2 target nucleic acids are NOT DETECTED.  The SARS-CoV-2 RNA is generally detectable in upper respiratoy specimens during the acute phase of infection. The lowest concentration of SARS-CoV-2 viral copies this assay can detect is 131 copies/mL. A negative result does not preclude SARS-Cov-2 infection and should not be used as the sole basis for treatment  or other patient management decisions. A negative result may occur with  improper specimen collection/handling, submission of specimen other than nasopharyngeal swab, presence of viral mutation(s) within the areas targeted by this assay, and inadequate number of viral copies (<131 copies/mL). A negative result must be combined with clinical observations, patient history, and epidemiological information. The expected result is Negative.  Fact Sheet for Patients:  https://www.moore.com/  Fact Sheet for Healthcare Providers:  https://www.young.biz/  This test is no t yet approved or cleared by the [place] FDA and  has been authorized for detection and/or diagnosis of SARS-CoV-2 by FDA under an Emergency Use Authorization (EUA). This EUA will remain  in effect (meaning this test can be used) for the duration of the COVID-19 declaration under Section 564(b)(1) of the Act, 21 U.S.C. section 360bbb-3(b)(1), unless the authorization is terminated or revoked sooner.     Influenza A by PCR NEGATIVE NEGATIVE Final   Influenza B by PCR NEGATIVE NEGATIVE Final    Comment: (NOTE) The Xpert Xpress SARS-CoV-2/FLU/RSV assay is intended as an aid in  the diagnosis of influenza from Nasopharyngeal swab specimens and  should not be used as a sole basis for treatment. Nasal washings and  aspirates are unacceptable for Xpert Xpress SARS-CoV-2/FLU/RSV  testing.  Fact Sheet for Patients: https://www.moore.com/  Fact Sheet for Healthcare Providers: https://www.young.biz/  This test is not yet approved or cleared by the [place] FDA and  has been authorized for detection and/or diagnosis of SARS-CoV-2 by  FDA under an Emergency Use Authorization (EUA). This EUA will remain  in effect (meaning this test can be used) for the duration of the  Covid-19 declaration under Section 564(b)(1) of the Act, 21  U.S.C.  section 360bbb-3(b)(1), unless the authorization is  terminated or revoked. Performed at [facility], [address], [place], [place] [address]      Patient was seen and examined on the day of discharge and was found to be in stable condition. Time coordinating discharge: 35 minutes including assessment and coordination of care, as well as examination of the patient.   SIGNED:  Victorino Dike  Alvino Chapelhoi, DO [facility] 06/29/2020, 10:51 AM

## 2020-06-24 NOTE — TOC Progression Note (Addendum)
Transition of Care [facility]) - Progression Note    Patient Details  Name: HARCE VOLDEN MRN: [id number] Date of Birth: [birth year]  Transition [facility]) CM/SW Contact  Juan Olthoff, Olegario Messier, RN Phone Number: 06/24/2020, 1:43 PM  Clinical Narrative:Clarification:Per [facility] rep Dedra-they are still waiting on insurance auth-MD/Nsg updated.    3:50p-[facility] rep still waiting on insurance auth. PTAR forms in shadow chart if auth given within a safe time we may be able to d/c today-will need rm#,tel# for report.   Expected Discharge Plan: Skilled Nursing Facility Barriers to Discharge: Insurance Authorization  Expected Discharge Plan and Services Expected Discharge Plan: Skilled Nursing Facility   Discharge Planning Services: CM Consult Post Acute Care Choice:  (transitional housing) Living arrangements for the past 2 months: Homeless Shelter Expected Discharge Date: 06/24/20                                     Social Determinants of Health (SDOH) Interventions Housing Interventions: [id number] Referral (Homeless in [place].) Transportation Interventions: [id number] Referral (Transportation-bus pass.) Alcohol Brief Interventions/Follow-up: Alcohol Education  Readmission Risk Interventions No flowsheet data found.

## 2020-06-24 NOTE — TOC Transition Note (Addendum)
Transition of Care [facility]) - CM/SW Discharge Note   Patient Details  Name: [name] MRN: [id number] Date of Birth: [birth year]  Transition of Care [facility]) CM/SW Contact:  Lanier Clam, RN Phone Number: 06/24/2020, 10:33 AM   Clinical Narrative:Received auth for SNF-[facility]-await covid results, & rm#,tel# for nsg report.PTAR for transport.     Final next level of care: Skilled Nursing Facility Barriers to Discharge: No Barriers Identified   Patient Goals and CMS Choice Patient states their goals for this hospitalization and ongoing recovery are:: go to rehab CMS Medicare.gov Compare Post Acute Care list provided to:: Patient Choice offered to / list presented to : Patient  Discharge Placement              Patient chooses bed at: Other - please specify in the comment section below: [facility]) Patient to be transferred to facility by: PTAR Name of family member notified: Mahalia Longest [phone] Patient and family notified of of transfer: 06/24/20  Discharge Plan and Services   Discharge Planning Services: CM Consult Post Acute Care Choice:  (transitional housing)                               Social Determinants of Health (SDOH) Interventions Housing Interventions: [id number] Referral (Homeless in [place].) Transportation Interventions: [id number] Referral (Transportation-bus pass.) Alcohol Brief Interventions/Follow-up: Alcohol Education   Readmission Risk Interventions No flowsheet data found.

## 2020-06-24 NOTE — Care Management Important Message (Signed)
Important Message  Patient Details IM Letter given to the Patient Name: DAQUANE AGUILAR MRN: [id number] Date of Birth: [birth year]   Medicare Important Message Given:  Yes     Caren Macadam 06/24/2020, 12:37 PM

## 2020-06-25 DIAGNOSIS — J189 Pneumonia, unspecified organism: Secondary | ICD-10-CM

## 2020-06-25 LAB — VITAMIN B1: Vitamin B1 (Thiamine): 124.9 nmol/L (ref 66.5–200.0)

## 2020-06-25 NOTE — Progress Notes (Signed)
  PROGRESS NOTE  Patient remains stable for discharge to SNF. Awaiting insurance auth. He is feeling well today without new complaints or questions.    Noralee Stain, DO [facility] 06/25/2020, 9:34 AM  Available via Epic secure chat 7am-7pm After these hours, please refer to coverage provider listed on [email or web address]

## 2020-06-25 NOTE — TOC Progression Note (Signed)
Transition of Care [facility]) - Progression Note    Patient Details  Name: TADHG ESKEW MRN: [id number] Date of Birth: 28-Sep-1945  Transition of Care [facility]) CM/SW Contact  Flor Houdeshell, Olegario Messier, RN Phone Number: 06/25/2020, 2:45 PM  Clinical Narrative:  Spoke to [facility] rep Dedra-still waiting on auth.Will have a bed available over weekend.     Expected Discharge Plan: Skilled Nursing Facility Barriers to Discharge: Insurance Authorization  Expected Discharge Plan and Services Expected Discharge Plan: Skilled Nursing Facility   Discharge Planning Services: CM Consult Post Acute Care Choice:  (transitional housing) Living arrangements for the past 2 months: Homeless Shelter Expected Discharge Date: 06/24/20                                     Social Determinants of Health (SDOH) Interventions Housing Interventions: [id number] Referral (Homeless in [place].) Transportation Interventions: [id number] Referral (Transportation-bus pass.) Alcohol Brief Interventions/Follow-up: Alcohol Education  Readmission Risk Interventions No flowsheet data found.

## 2020-06-25 NOTE — Plan of Care (Signed)
  Problem: Coping: Goal: Level of anxiety will decrease Outcome: Progressing   Problem: Pain Managment: Goal: General experience of comfort will improve Outcome: Progressing   

## 2020-06-25 NOTE — Progress Notes (Signed)
Physical Therapy Treatment Patient Details Name: Jeremiah Garcia MRN: [id number] DOB: Aug 02, 1946 Today's Date: 06/25/2020    History of Present Illness Pt admitted with fever and urinary frequency and now with dx of SIRS vs sepsis.  Pt with hx of CVA, ETOH abuse and tremor    PT Comments    Pt OOB in recliner.  Assisted with amb in hallway.  General Gait Details: Trial amb without any AD as prior.  Present with gait instability and decreased stance time L LE due to L hip "bad". Performed a BERG balance test in which pt scored a 35/56 indicating HIGH FALL RISK and AD needed.  Pt plans to D/C to SNF.   Follow Up Recommendations  SNF     Equipment Recommendations       Recommendations for Other Services       Precautions / Restrictions Precautions Precautions: Fall    Mobility  Bed Mobility               General bed mobility comments: OOB in recliner  Transfers Overall transfer level: Needs assistance Equipment used: None Transfers: Sit to/from Stand;Stand Pivot Transfers Sit to Stand: Min guard Stand pivot transfers: Min guard;Min assist       General transfer comment: 25% VC's on proper hand placement and safety with turns.  Good use of B UE's to steady self.  Ambulation/Gait Ambulation/Gait assistance: Min assist Gait Distance (Feet): 55 Feet Assistive device: None Gait Pattern/deviations: Step-through pattern;Decreased stride length Gait velocity: decreased   General Gait Details: Trial amb without any AD as prior.  Present with gait instability and decreased stance time L LE due to L hip "bad".   Stairs             Wheelchair Mobility    Modified Rankin (Stroke Patients Only)       Balance Overall balance assessment: Needs assistance                               Standardized Balance Assessment Standardized Balance Assessment : Berg Balance Test Berg Balance Test Sit to Stand: Able to stand without using hands and  stabilize independently Standing Unsupported: Able to stand 2 minutes with supervision Sitting with Back Unsupported but Feet Supported on Floor or Stool: Able to sit safely and securely 2 minutes Stand to Sit: Controls descent by using hands Transfers: Able to transfer safely, definite need of hands Standing Unsupported with Eyes Closed: Able to stand 10 seconds with supervision Standing Ubsupported with Feet Together: Needs help to attain position but able to stand for 30 seconds with feet together From Standing, Reach Forward with Outstretched Arm: Can reach forward >12 cm safely (5") From Standing Position, Pick up Object from Floor: Able to pick up shoe, needs supervision From Standing Position, Turn to Look Behind Over each Shoulder: Looks behind from both sides and weight shifts well Turn 360 Degrees: Needs close supervision or verbal cueing Standing Unsupported, Alternately Place Feet on Step/Stool: Able to complete >2 steps/needs minimal assist Standing Unsupported, One Foot in Front: Needs help to step but can hold 15 seconds Standing on One Leg: Tries to lift leg/unable to hold 3 seconds but remains standing independently Total Score: 35/56    HIGH FALL RISK      Cognition Arousal/Alertness: Awake/alert Behavior During Therapy: WFL for tasks assessed/performed;Flat affect Overall Cognitive Status: Within Functional Limits for tasks assessed  General Comments: Very pleasant and cooperative.  Flat      Exercises      General Comments        Pertinent Vitals/Pain Pain Assessment: No/denies pain    Home Living                      Prior Function            PT Goals (current goals can now be found in the care plan section) Progress towards PT goals: Progressing toward goals    Frequency    Min 3X/week      PT Plan Discharge plan needs to be updated    Co-evaluation              AM-PAC PT "6  Clicks" Mobility   Outcome Measure  Help needed turning from your back to your side while in a flat bed without using bedrails?: None Help needed moving from lying on your back to sitting on the side of a flat bed without using bedrails?: None Help needed moving to and from a bed to a chair (including a wheelchair)?: A Little Help needed standing up from a chair using your arms (e.g., wheelchair or bedside chair)?: A Little Help needed to walk in hospital room?: A Little Help needed climbing 3-5 steps with a railing? : A Lot 6 Click Score: 19    End of Session Equipment Utilized During Treatment: Gait belt Activity Tolerance: Patient tolerated treatment well Patient left: in chair;with call bell/phone within reach;with chair alarm set Nurse Communication: Mobility status PT Visit Diagnosis: Difficulty in walking, not elsewhere classified (R26.2);Muscle weakness (generalized) (M62.81);Unsteadiness on feet (R26.81)     Time: 8756-4332 PT Time Calculation (min) (ACUTE ONLY): 34 min  Charges:  $Gait Training: 8-22 mins $Therapeutic Activity: 8-22 mins                     Felecia Shelling  PTA Acute  Rehabilitation Services Pager      [phone] Office      [phone]

## 2020-06-26 NOTE — TOC Progression Note (Signed)
Transition of Care [facility]) - Progression Note    Patient Details  Name: Jeremiah Garcia MRN: [id number] Date of Birth: 1946-01-12  [facility]) CM/SW Contact  Darleene Cleaver, [place] Phone Number: 06/26/2020, 11:55 AM  Clinical Narrative:     Patient still does not have insurance authorization.  CSW continuing to follow patient's progress throughout discharge.   Expected Discharge Plan: Skilled Nursing Facility Barriers to Discharge: Insurance Authorization  Expected Discharge Plan and Services Expected Discharge Plan: Skilled Nursing Facility   Discharge Planning Services: CM Consult Post Acute Care Choice:  (transitional housing) Living arrangements for the past 2 months: Homeless Shelter Expected Discharge Date: 06/24/20                                     Social Determinants of Health (SDOH) Interventions Housing Interventions: [id number] Referral (Homeless in [place].) Transportation Interventions: [id number] Referral (Transportation-bus pass.) Alcohol Brief Interventions/Follow-up: Alcohol Education  Readmission Risk Interventions No flowsheet data found.

## 2020-06-26 NOTE — Plan of Care (Signed)

## 2020-06-26 NOTE — Progress Notes (Signed)
°  PROGRESS NOTE  Patient remains stable for discharge to SNF. He voices no concerns this morning. Awaiting insurance auth.   Noralee Stain, DO [facility] 06/26/2020, 9:25 AM  Available via Epic secure chat 7am-7pm After these hours, please refer to coverage provider listed on [email or web address]

## 2020-06-27 NOTE — Plan of Care (Signed)
  Problem: Education: Goal: Knowledge of General Education information will improve Description: Including pain rating scale, medication(s)/side effects and non-pharmacologic comfort measures Outcome: Progressing   Problem: Health Behavior/Discharge Planning: Goal: Ability to manage health-related needs will improve Outcome: Progressing   Problem: Clinical Measurements: Goal: Ability to maintain clinical measurements within normal limits will improve Outcome: Progressing Goal: Will remain free from infection Outcome: Progressing Goal: Respiratory complications will improve Outcome: Progressing   Problem: Activity: Goal: Risk for activity intolerance will decrease Outcome: Progressing   Problem: Safety: Goal: Ability to remain free from injury will improve Outcome: Progressing   Problem: Skin Integrity: Goal: Risk for impaired skin integrity will decrease Outcome: Progressing   

## 2020-06-27 NOTE — Progress Notes (Signed)
Occupational Therapy Treatment Patient Details Name: Jeremiah Garcia MRN: [id number] DOB: [birth year] Today's Date: 06/27/2020    History of present illness Pt admitted with fever and urinary frequency and now with dx of SIRS vs sepsis.  Pt with hx of CVA, ETOH abuse and tremor   OT comments  Treatment focused on improving independence with ADLs. Patient performed bed mobility with supervision, stood at sink to perform grooming, ambulated in room, performed toilet transfer and straightened bed pad on bed with min guard from therapist. [name] continues to demonstrate impaired balance needing hand holds to steady himself. Patient did not want to use walker during tasks. Recommend short term rehab due to continued fall risk.  Follow Up Recommendations  Home health OT;SNF    Equipment Recommendations  None recommended by OT    Recommendations for Other Services      Precautions / Restrictions Precautions Precautions: Fall Restrictions Weight Bearing Restrictions: No       Mobility Bed Mobility Overal bed mobility: Needs Assistance Bed Mobility: Supine to Sit;Sit to Supine     Supine to sit: Supervision;HOB elevated Sit to supine: Supervision   General bed mobility comments: supervision for bed mobility.  Transfers                      Balance Overall balance assessment: Mild deficits observed, not formally tested             Standing balance comment: Using hand holds throughout room to stabilize himself.                           ADL either performed or assessed with clinical judgement   ADL       Grooming: Standing;Wash/dry hands Grooming Details (indicate cue type and reason): standint at sink to perform grooming tasks. Tending to keep hands on sink or lean on sink for stability.             Lower Body Dressing: Supervision/safety Lower Body Dressing Details (indicate cue type and reason): supervision for donning socks at side of  bed. Toilet Transfer: Hydrographic surveyor Details (indicate cue type and reason): min guard to perform toilet transfer. use of grab bar and pushing off of toilet to rise.                 Vision Patient Visual Report: No change from baseline     Perception     Praxis      Cognition Arousal/Alertness: Awake/alert Behavior During Therapy: WFL for tasks assessed/performed Overall Cognitive Status: Within Functional Limits for tasks assessed                                          Exercises     Shoulder Instructions       General Comments      Pertinent Vitals/ Pain       Pain Assessment: No/denies pain  Home Living                                          Prior Functioning/Environment              Frequency           Progress Toward Goals  OT Goals(current goals can now be found in the care plan section)  Progress towards OT goals: Progressing toward goals  Acute Rehab OT Goals Patient Stated Goal: Regain IND OT Goal Formulation: With patient Time For Goal Achievement: 07/05/20 Potential to Achieve Goals: Good  Plan Discharge plan remains appropriate    Co-evaluation                 AM-PAC OT "6 Clicks" Daily Activity     Outcome Measure   Help from another person eating meals?: None Help from another person taking care of personal grooming?: A Little Help from another person toileting, which includes using toliet, bedpan, or urinal?: A Little Help from another person bathing (including washing, rinsing, drying)?: A Little Help from another person to put on and taking off regular upper body clothing?: A Little Help from another person to put on and taking off regular lower body clothing?: A Little 6 Click Score: 19    End of Session    OT Visit Diagnosis: Unsteadiness on feet (R26.81);Muscle weakness (generalized) (M62.81)   Activity Tolerance Patient tolerated treatment well   Patient  Left in bed;with call bell/phone within reach;with bed alarm set   Nurse Communication Mobility status        Time: 6644-0347 OT Time Calculation (min): 9 min  Charges: OT General Charges $OT Visit: 1 Visit OT Treatments $Self Care/Home Management : 8-22 mins  Waldron Session, OTR/L Acute Care Rehab Services  Office [phone] Pager: [phone]    Kelli Churn 06/27/2020, 12:15 PM

## 2020-06-27 NOTE — Progress Notes (Signed)
°  PROGRESS NOTE  No change. Remains stable for SNF placement once insurance auth available.    Noralee Stain, DO [facility] 06/27/2020, 12:03 PM  Available via Epic secure chat 7am-7pm After these hours, please refer to coverage provider listed on [email or web address]

## 2020-06-27 NOTE — Plan of Care (Signed)

## 2020-06-28 NOTE — Plan of Care (Signed)

## 2020-06-28 NOTE — Care Management Important Message (Signed)
Important Message  Patient Details IM Letter given to the Patient Name: BERTEL VENARD MRN: [id number] Date of Birth: 04-Apr-1946   Medicare Important Message Given:  Yes     Caren Macadam 06/28/2020, 11:57 AM

## 2020-06-28 NOTE — TOC Progression Note (Signed)
Transition of Care [facility]) - Progression Note    Patient Details  Name: FRAN MCREE MRN: [id number] Date of Birth: [birth year]  Transition of Care [facility]) CM/SW Contact  Armanda Heritage, RN Phone Number: 06/28/2020, 11:40 AM  Clinical Narrative:    CM followed up with facility rep who states still waiting for insurance authorization in order for patient to admit to SNF.    Expected Discharge Plan: Skilled Nursing Facility Barriers to Discharge: Insurance Authorization  Expected Discharge Plan and Services Expected Discharge Plan: Skilled Nursing Facility   Discharge Planning Services: CM Consult Post Acute Care Choice:  (transitional housing) Living arrangements for the past 2 months: Homeless Shelter Expected Discharge Date: 06/24/20                                     Social Determinants of Health (SDOH) Interventions Housing Interventions: [id number] Referral (Homeless in [place].) Transportation Interventions: [id number] Referral (Transportation-bus pass.) Alcohol Brief Interventions/Follow-up: Alcohol Education  Readmission Risk Interventions No flowsheet data found.

## 2020-06-28 NOTE — Progress Notes (Signed)
Physical Therapy Treatment Patient Details Name: Jeremiah Garcia MRN: [id number] DOB: 1946/06/30 Today's Date: 06/28/2020    History of Present Illness Pt admitted with fever and urinary frequency and now with dx of SIRS vs sepsis.  Pt with hx of CVA, ETOH abuse and tremor    PT Comments    General Gait Details: amb with SPC this session with 25% VC's on proper tech and safety with turns.   Follow Up Recommendations  SNF     Equipment Recommendations  Cane    Recommendations for Other Services       Precautions / Restrictions Precautions Precautions: Fall    Mobility  Bed Mobility               General bed mobility comments: OOB in recliner  Transfers Overall transfer level: Needs assistance Equipment used: None Transfers: Sit to/from Stand;Stand Pivot Transfers Sit to Stand: Supervision Stand pivot transfers: Supervision;Min guard       General transfer comment: 25% VC's on proper hand placement and safety with turns.  Good use of B UE's to steady self.  Ambulation/Gait Ambulation/Gait assistance: Min assist;Min guard Gait Distance (Feet): 85 Feet Assistive device: Straight cane Gait Pattern/deviations: Step-through pattern;Decreased stride length Gait velocity: decreased   General Gait Details: amb with SPC this session with 25% VC's on proper tech and safety with turns.   Stairs             Wheelchair Mobility    Modified Rankin (Stroke Patients Only)       Balance                                            Cognition Arousal/Alertness: Awake/alert Behavior During Therapy: WFL for tasks assessed/performed                                   General Comments: Very pleasant and cooperative.  Flat      Exercises      General Comments        Pertinent Vitals/Pain Pain Assessment: No/denies pain    Home Living                      Prior Function            PT Goals (current  goals can now be found in the care plan section) Progress towards PT goals: Progressing toward goals    Frequency    Min 3X/week      PT Plan Discharge plan needs to be updated    Co-evaluation              AM-PAC PT "6 Clicks" Mobility   Outcome Measure  Help needed turning from your back to your side while in a flat bed without using bedrails?: None Help needed moving from lying on your back to sitting on the side of a flat bed without using bedrails?: None Help needed moving to and from a bed to a chair (including a wheelchair)?: A Little Help needed standing up from a chair using your arms (e.g., wheelchair or bedside chair)?: A Little Help needed to walk in hospital room?: A Little Help needed climbing 3-5 steps with a railing? : A Lot 6 Click Score: 19    End of Session Equipment Utilized  During Treatment: Gait belt Activity Tolerance: Patient tolerated treatment well Patient left: in chair;with call bell/phone within reach;with chair alarm set Nurse Communication: Mobility status PT Visit Diagnosis: Difficulty in walking, not elsewhere classified (R26.2);Muscle weakness (generalized) (M62.81);Unsteadiness on feet (R26.81)     Time: 9562-1308 PT Time Calculation (min) (ACUTE ONLY): 13 min  Charges:  $Gait Training: 8-22 mins                     {Kennis Wissmann  PTA Acute  Rehabilitation Services Pager      [phone] Office      [phone]

## 2020-06-28 NOTE — Progress Notes (Signed)
  PROGRESS NOTE  No new change. Asking for breakfast. Awaiting insurance auth for SNF discharge.    Noralee Stain, DO [facility] 06/28/2020, 9:36 AM  Available via Epic secure chat 7am-7pm After these hours, please refer to coverage provider listed on [email or web address]

## 2020-06-29 NOTE — TOC Progression Note (Signed)
Transition of Care [facility]) - Progression Note    Patient Details  Name: Jeremiah Garcia MRN: [id number] Date of Birth: [birth year]  Transition of Care [facility]) CM/SW Contact  Kalyani Maeda, Olegario Messier, RN Phone Number: 06/29/2020, 9:55 AM  Clinical Narrative: TC [facility] rep Dedra-need ltherapy notes within last 48hrs-sent via hub-still awaiting auth.     Expected Discharge Plan: Skilled Nursing Facility Barriers to Discharge: Insurance Authorization  Expected Discharge Plan and Services Expected Discharge Plan: Skilled Nursing Facility   Discharge Planning Services: CM Consult Post Acute Care Choice:  (transitional housing) Living arrangements for the past 2 months: Homeless Shelter Expected Discharge Date: 06/24/20                                     Social Determinants of Health (SDOH) Interventions Housing Interventions: [id number] Referral (Homeless in [place].) Transportation Interventions: [id number] Referral (Transportation-bus pass.) Alcohol Brief Interventions/Follow-up: Alcohol Education  Readmission Risk Interventions No flowsheet data found.

## 2020-06-29 NOTE — TOC Progression Note (Addendum)
Transition of Care [facility]) - Progression Note    Patient Details  Name: NAVRAJ DREIBELBIS MRN: [id number] Date of Birth: 09-01-1946  Transition of Care [facility]) CM/SW Contact  Georga Stys, Olegario Messier, RN Phone Number: 06/29/2020, 10:43 AM  Clinical Narrative: MD informed of SNF-[facility] denial by insurance-await if agree to do peer to peer tel#1 [phone] 3 by 4:30p today.   12p-Spoke to MD-not agreement to do peer to peer. Spoke to [employer] she plans to appeal the d/c-await process. Also still discussing resources with [employer] resources. Will also look into medicaid for LTC. 4p-Patient does not meet LTC criteria-no rehab skill-ambulating 60ft.Patient has resurces for dtr to asst /making decisions for d/c-[facility],has shelter list,referred to [facility] care [facility], patient has social security,dtr also provided info for ALF, indep living facilities.   Expected Discharge Plan: Skilled Nursing Facility Barriers to Discharge: SNF Authorization Denied, Other (comment) (peer to peer)  Expected Discharge Plan and Services Expected Discharge Plan: Skilled Nursing Facility   Discharge Planning Services: CM Consult Post Acute Care Choice:  (transitional housing) Living arrangements for the past 2 months: Homeless Shelter Expected Discharge Date: 06/24/20                                     Social Determinants of Health (SDOH) Interventions Housing Interventions: [id number] Referral (Homeless in [place].) Transportation Interventions: [id number] Referral (Transportation-bus pass.) Alcohol Brief Interventions/Follow-up: Alcohol Education  Readmission Risk Interventions No flowsheet data found.

## 2020-06-29 NOTE — Plan of Care (Signed)

## 2020-06-29 NOTE — NC FL2 (Signed)
[place] MEDICAID FL2 LEVEL OF CARE SCREENING TOOL     IDENTIFICATION  Patient Name: Jeremiah Garcia Birthdate: 1946/06/26 Sex: male Admission Date (Current Location): 06/19/2020  [place] and [place] Number:  Haynes Bast [id number] T Facility and Address:  [facility],  [address], [place] [address]      Provider Number: [id number]  Attending Physician Name and Address:  Noralee Stain, DO  Relative Name and Phone Number:  Mahalia Longest dtr [phone]    Current Level of Care: Hospital Recommended Level of Care: Nursing Facility Prior Approval Number:    Date Approved/Denied:   PASRR Number: [id number]  Discharge Plan: SNF    Current Diagnoses: Patient Active Problem List   Diagnosis Date Noted  . Community acquired pneumonia   . Bowel and bladder incontinence   . Urinary urgency   . Sepsis (HCC) 06/19/2020  . Postural dizziness with presyncope 06/19/2020  . Alcohol abuse 06/19/2020  . 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 EMPHYSEMA 12/09/2010  . ACUTE CYSTITIS 11/16/2010  . COUGH 11/16/2010  . DENTAL CARIES 08/22/2010  . Hyperlipidemia 05/17/2010  . GLUCOSE INTOLERANCE 05/16/2010  . Essential hypertension, benign 05/16/2010  . TOBACCO ABUSE 03/14/2010  . CAROTID ARTERY STENOSIS, 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    Orientation RESPIRATION BLADDER Height & Weight     Situation, Place  Normal Continent Weight: 86.2 kg Height:  6\' 2"  (188 cm)  BEHAVIORAL SYMPTOMS/MOOD NEUROLOGICAL BOWEL NUTRITION STATUS      Continent Diet (Regular/thins)  AMBULATORY STATUS COMMUNICATION OF NEEDS Skin   Limited Assist Verbally Normal                       Personal Care Assistance Level of Assistance  Feeding, Dressing   Feeding assistance: Limited assistance       Functional Limitations Info  Hearing, Speech, Sight Sight Info: Adequate Hearing Info: Adequate Speech Info: Adequate    SPECIAL CARE FACTORS FREQUENCY  PT (By  licensed PT), OT (By licensed OT)     PT Frequency: 5x week OT Frequency: 5x week            Contractures Contractures Info: Not present    Additional Factors Info  Code Status, Allergies Code Status Info:  (Full code) Allergies Info:  (NKA)           Current Medications (06/29/2020):  This is the current hospital active medication list Current Facility-Administered Medications  Medication Dose Route Frequency Provider Last Rate Last Admin  . acetaminophen (TYLENOL) tablet 650 mg  650 mg Oral Q6H PRN 08/29/2020, MD   650 mg at 06/20/20 1345   Or  . acetaminophen (TYLENOL) suppository 650 mg  650 mg Rectal Q6H PRN 08/20/20, MD   650 mg at 06/19/20 1828  . aspirin EC tablet 81 mg  81 mg Oral Daily 08/19/20, MD   81 mg at 06/29/20 1011  . chlorproMAZINE (THORAZINE) 25 mg in sodium chloride 0.9 % 25 mL IVPB  25 mg Intravenous Q6H PRN 08/29/20, MD      . enoxaparin (LOVENOX) injection 40 mg  40 mg Subcutaneous Q24H Rodolph Bong, MD   40 mg at 06/28/20 1336  . folic acid (FOLVITE) tablet 1 mg  1 mg Oral Daily 08/28/20, MD   1 mg at 06/29/20 1011  . magic mouthwash w/lidocaine  15 mL Oral QID 08/29/20, MD   15 mL at  06/29/20 1015  . metoprolol tartrate (LOPRESSOR) injection 5 mg  5 mg Intravenous Q6H PRN Jae Dire, MD      . multivitamin with minerals tablet 1 tablet  1 tablet Oral Daily Jae Dire, MD   1 tablet at 06/29/20 1011  . nicotine (NICODERM CQ - dosed in mg/24 hours) patch 14 mg  14 mg Transdermal Daily Marikay Alar, FNP   14 mg at 06/29/20 1015  . oxybutynin (DITROPAN-XL) 24 hr tablet 15 mg  15 mg Oral QHS Noralee Stain, DO   15 mg at 06/28/20 2155  . rosuvastatin (CRESTOR) tablet 40 mg  40 mg Oral Daily Jae Dire, MD   40 mg at 06/29/20 1011  . sodium chloride flush (NS) 0.9 % injection 3 mL  3 mL Intravenous Q12H Jae Dire, MD   3 mL at 06/29/20 1011  . thiamine tablet 100 mg  100 mg Oral Daily Jae Dire, MD   100 mg at 06/29/20 1011     Discharge Medications: Please see discharge summary for a list of discharge medications.  Relevant Imaging Results:  Relevant Lab Results:   Additional Information ss#[id number]  Tully Mcinturff, Olegario Messier, RN

## 2020-06-30 DIAGNOSIS — A419 Sepsis, unspecified organism: Principal | ICD-10-CM

## 2020-06-30 NOTE — Progress Notes (Signed)
Occupational Therapy Treatment Patient Details Name: Jeremiah Garcia MRN: [id number] DOB: 10/19/1945 Today's Date: 06/30/2020    History of present illness Pt admitted with fever and urinary frequency and now with dx of SIRS vs sepsis.  Pt with hx of CVA, ETOH abuse and tremor   OT comments  Treatment focused on improving mobility and ADLs to independence. Today patient supervision to donn socks, ambulate in room with cane and without, and stand at sink to groom. Required set up assist for oral care as he handed the therapist a thermometer and asked "is this the toothpaste?" Patient continues to have impaired balance and needs supervision for ADLs at this. Cont POC.   Follow Up Recommendations  Home health OT;SNF;Supervision/Assistance - 24 hour    Equipment Recommendations  None recommended by OT    Recommendations for Other Services      Precautions / Restrictions Precautions Precautions: Fall Restrictions Weight Bearing Restrictions: No       Mobility Bed Mobility               General bed mobility comments: Patient seated at side of bed when therapist entered the room.  Transfers Overall transfer level: Needs assistance Equipment used: Straight cane   Sit to Stand: Supervision         General transfer comment: Supervision to ambulate in room x 2 laps. Once without cane and the second lap with cane. Without cane patient using hand on furniture to steady himself. Improved with cane. No overt loss of balance.    Balance Overall balance assessment: Mild deficits observed, not formally tested                                         ADL either performed or assessed with clinical judgement   ADL       Grooming: Wash/dry hands;Wash/dry face;Oral care;Standing;Supervision/safety;Set up Grooming Details (indicate cue type and reason): Supervision to stand at sink and perform grooming task. Improved balance today without leaning on sink or  propping with one hand. When searching for toothpaste patient handed therapist a themometer and states "is this the toothpaste?"     Lower Body Bathing: Supervison/ safety Lower Body Bathing Details (indicate cue type and reason): Supervision to donn socks.                             Vision       Perception     Praxis      Cognition Arousal/Alertness: Awake/alert Behavior During Therapy: WFL for tasks assessed/performed Overall Cognitive Status: Within Functional Limits for tasks assessed                                          Exercises     Shoulder Instructions       General Comments      Pertinent Vitals/ Pain       Pain Assessment: No/denies pain  Home Living                                          Prior Functioning/Environment  Frequency  Min 2X/week        Progress Toward Goals  OT Goals(current goals can now be found in the care plan section)  Progress towards OT goals: Progressing toward goals  Acute Rehab OT Goals Patient Stated Goal: Regain IND OT Goal Formulation: With patient Time For Goal Achievement: 07/05/20 Potential to Achieve Goals: Good  Plan Discharge plan remains appropriate    Co-evaluation                 AM-PAC OT "6 Clicks" Daily Activity     Outcome Measure   Help from another person eating meals?: None Help from another person taking care of personal grooming?: A Little Help from another person toileting, which includes using toliet, bedpan, or urinal?: A Little Help from another person bathing (including washing, rinsing, drying)?: A Little Help from another person to put on and taking off regular upper body clothing?: A Little Help from another person to put on and taking off regular lower body clothing?: A Little 6 Click Score: 19    End of Session Equipment Utilized During Treatment: Other (comment) (cane)  OT Visit Diagnosis: Unsteadiness  on feet (R26.81);Muscle weakness (generalized) (M62.81)   Activity Tolerance Patient tolerated treatment well   Patient Left in chair;with call bell/phone within reach;with chair alarm set;with nursing/sitter in room   Nurse Communication Mobility status        Time: 1049-1101 OT Time Calculation (min): 12 min  Charges: OT Treatments $Self Care/Home Management : 8-22 mins  Waldron Session, OTR/L Acute Care Rehab Services  Office [phone] Pager: [phone]    Kelli Churn 06/30/2020, 12:45 PM

## 2020-06-30 NOTE — TOC Progression Note (Addendum)
Transition of Care [facility]) - Progression Note    Patient Details  Name: Jeremiah Garcia MRN: [id number] Date of Birth: 05-07-1946  Transition of Care [facility]) CM/SW Contact  Hillman Attig, Olegario Messier, RN Phone Number: 06/30/2020, 2:23 PM  Clinical Narrative: [facility]-unable to accept for LTC or SNF-due to no skill-patient recc for HHPT/HHOT,cane. Dtr Bridgette informed of still awaiting appeal process to initiate-since hospital has not received a case#. [facility] willing to do HHC-depending on location @ d/c.Will provide Indep living/ALF as a resource. Also [place] care 360-community resource still following for resources also.  4p-TC dtr about appeal process-she is still waiting to hear back from medicare(ke[employer]);informed her that since patient was d/c yesterday we can no longer wait for medicare to call her back she needs to provide a case# or we have to d/c patient-she understood & gave a address for patient to go-[address] [place] patient c#[phone].Nsg to provide transportation-this is the address the patient stayed PTA per dtr. Provided dtr w/hospital issued non coverage service form over phone-verbal info given & signed.Will mail form to patient's address per agreement from dtr since patient has just left via transport. No further CM needs.    Expected Discharge Plan: Home w Home Health Services Barriers to Discharge: SNF Authorization Denied, Other (comment) (peer to peer)  Expected Discharge Plan and Services Expected Discharge Plan: Home w Home Health Services   Discharge Planning Services: CM Consult Post Acute Care Choice:  (transitional housing) Living arrangements for past 2 months:apartment;but declines to return due to conditions not acceptable to patient-having to sleep on floor. Expected Discharge Date: 06/29/20                                     Social Determinants of Health (SDOH) Interventions Housing Interventions: [id number] Referral (Homeless in [place].) Transportation  Interventions: [id number] Referral (Transportation-bus pass.) Alcohol Brief Interventions/Follow-up: Alcohol Education  Readmission Risk Interventions No flowsheet data found.

## 2020-06-30 NOTE — Progress Notes (Signed)
PROGRESS NOTE    Jeremiah Garcia  MNO:[id number] DOB: 04/15/1946 DOA: 06/19/2020 PCP: Benito Mccreedy, MD    Chief Complaint  Patient presents with  . Urinary Frequency  . Fever    Brief Narrative:  HPI per Dr. Neysa Bonito Patient is a poor historian and much of the history is obtained from prior notes as well as his granddaughter, [name], over the phone  This is a 74 year old male with past medical history of CVA with residual LUE weakness, hypertension, hyperlipidemia, alcohol abuse, tobacco use who presented to the ED for evaluation of presyncopal episode yesterday.  According to the patient, he was concerned that he may have a UTI as he has been urinating on himself frequently over the past month and more so over the last several days and denies any dysuria or known fevers.  According to the ED provider note, patient was sitting outside under a tent with his friends when he got up and tried to switch chairs when he felt lightheaded and almost fell down to the ground however his friend caught him and never hit the ground.  Felt lightheaded when he stood up from sitting. He denies any known fever, cough, sick contacts. He reports that his last drink was two days ago and drinks alcohol daily.   According to his granddaughter, the patient was staying with her in [place], [place] and then moved back to [place] recently. She mentioned he is an alcoholic, and she noticed he was shaking a lot and was getting lost frequently. There was some concern for dementia and she mentioned that he also may have a history of renal stones.   ED Course: ED vitals: T 103.26F, HR 102, BP 161/81, 97% on room air.  Notable labs: Sodium 130, glucose 123, AST 48, ALT 23, lactic acid 1.7, WBC 12.7.  COVID-19 and flu negative.  UA cloudy with large Hb, 5 ketones, negative nitrites, negative leukocytes, protein> 300, rare bacteria, WBCs 11-20.  CXR with patchy retrocardiac opacity which could be due to atelectasis  and/or infectious etiology.  He was given ceftriaxone and azithromycin in the ED for CAP coverage.   Assessment & Plan:   Principal Problem:   Sepsis ([place]) Active Problems:   Hyperlipidemia   Essential hypertension, benign   Postural dizziness with presyncope   Alcohol abuse   Community acquired pneumonia   Bowel and bladder incontinence   Urinary urgency  1 Early sepsis with likely source community-acquired pneumonia Patient admitted AND met criteria for SIRS with fever, tachycardia, leukocytosis.  No lactic acidosis.  Chest x-ray done concerning for retrocardiac opacity concerning for pneumonia.  CT abdomen and pelvis with irregularly thick-walled bladder, underdistended.  Patchy left lower lobe opacity suspicious for pneumonia.  Mild right basilar opacity likely atelectasis.  Patient placed on IV antibiotics initially and transition to oral antibiotics.  Patient completed full course of antibiotics during this hospitalization.  No further antibiotics needed.    2.  Postural dizziness with presyncope Secondary to dehydration and volume depletion.  Resolved with hydration.   3.  Alcohol abuse, concern for withdrawal Patient with no signs of withdrawal on examination.  Patient ammonia level on admission was 13.  Patient initially placed on Ativan withdrawal protocol, thiamine, folic acid, multivitamin.  Stable.  Outpatient follow-up.     4.  Bowel and bladder incontinence Per family patient with bowel and bladder incontinence.  Patient may have some overflow incontinence.  CT abdomen and pelvis with no significant abnormalities noted.  Improvement with  bladder incontinence after being started on Ditropan.  Improvement with bowel incontinence per staff.  Patient with no further episodes of bowel or bladder incontinence during this hospitalization.  Outpatient follow-up.    5.  Urinary urgency/frequency Patient with some hematuria and proteinuria on UA.  CT abdomen and pelvis negative for  any ureteral stones.  Hemoglobin A1c at 5.9.  Resolved with Ditropan.  Outpatient follow-up.   6.  Hypertension Blood pressure noted to be borderline on admission.  Patient's antihypertensive medications were discontinued during the hospitalization.  Blood pressure remained stable.  Outpatient follow-up.   7.  Prior history of CVA Remained stable.  Patient maintained on aspirin and statin.  Hemoglobin A1c 5.9.  Patient was seen by PT OT.  Outpatient follow-up.   8.  Suspected underlying dementia plus or minus Parkinson's Patient with some questionable tremors, likely secondary to alcohol withdrawal.  Tremors improved.  Thiamine level was 124.9.  Vitamin B12 levels at 486.  Patient maintained on thiamine, folic acid, multivitamin.  Outpatient follow-up.   9.  Poor oral intake Likely secondary to alcohol abuse.  Patient also states difficult for him to chew his food due to problems with his teeth and mouth pain and as such diet was changed to a soft diet.Marland KitchenSLP.  Patient was placed on Magic mouthwash with lidocaine secondary to mild pain with clinical improvement.  Tolerating soft diet.  Outpatient follow-up.    10.  Homelessness Social work consulted.    11.  Hypokalemia/hypophosphatemia Repleted.  12.  Transaminitis/ + Hep C antibody Elevated troponin likely secondary to alcohol use.  Hep C antibody positive.  Hepatitis C RNA 4,130,000.  Outpatient follow-up for further evaluation and management.    DVT prophylaxis: Lovenox Code Status: Full Family Communication: Updated patient.  No family at bedside.  Disposition:   Status is: Inpatient    Dispo: The patient is from: Homeless              Anticipated d/c is to: Per social work              Anticipated d/c date is: 06/30/2020              Patient currently medically stable for discharge.  Awaiting placement.        Consultants:   None  Procedures:   CT abdomen and pelvis 06/19/2020  Chest x-ray  06/19/2020  Antimicrobials:   IV Rocephin 06/19/2020>>>>> 06/22/2020  IV azithromycin 06/19/2020>>>> oral azithromycin 06/23/2020  Oral Vantin/Omnicef    Subjective: Patient sitting up on the side of the bed.  Denies any chest pain or shortness of breath.  Denies any bowel or urinary incontinence.  Feels better.  Tolerating soft diet.  Objective: Vitals:   06/29/20 1209 06/29/20 2157 06/30/20 0455 06/30/20 0940  BP: 110/69 120/74 118/75 134/73  Pulse: 74 66 63 70  Resp: _0 Temp: 98.3 F (36.8 C) 98.1 F (36.7 C) 97.8 F (36.6 C) 98.1 F (36.7 C)  TempSrc: Oral Oral Oral Oral  SpO2: 99% 98% 100% 100%  Weight:      Height:        Intake/Output Summary (Last 24 hours) at 06/30/2020 1040 Last data filed at 06/30/2020 0459 Gross per 24 hour  Intake 480 ml  Output 975 ml  Net -495 ml   Filed Weights   06/19/20 0209 06/19/20 0211  Weight: 86.2 kg 86.2 kg    Examination:  General exam: NAD Respiratory system: CTAB.  No wheezes, no  crackles, no rhonchi.  Normal respiratory effort.  Cardiovascular system: Regular rate rhythm no murmurs rubs or gallops.  No JVD.  No lower extremity edema.  Gastrointestinal system: Abdomen is soft, nontender, nondistended, positive bowel sounds.  No rebound.  No guarding. Central nervous system: Alert and oriented. No focal neurological deficits. Extremities: Symmetric 5 x 5 power. Skin: No rashes, lesions or ulcers Psychiatry: Judgement and insight appear fair. Mood & affect appropriate.     Data Reviewed: I have personally reviewed following labs and imaging studies  CBC: No results for input(s): WBC, NEUTROABS, HGB, HCT, MCV, PLT in the last 168 hours.  Basic Metabolic Panel: Recent Labs  Lab 06/24/20 0432  NA 139  K 3.5  CL 107  CO2 22  GLUCOSE 101*  BUN 13  CREATININE 0.91  CALCIUM 8.7*  MG 2.4    GFR: Estimated Creatinine Clearance: 82.8 mL/min (by C-G formula based on SCr of 0.91 mg/dL).  Liver Function  Tests: No results for input(s): AST, ALT, ALKPHOS, BILITOT, PROT, ALBUMIN in the last 168 hours.  CBG: No results for input(s): GLUCAP in the last 168 hours.   Recent Results (from the past 240 hour(s))  Culture, Urine     Status: None   Collection Time: 06/21/20  1:50 PM   Specimen: Urine, Clean Catch  Result Value Ref Range Status   Specimen Description   Final    URINE, CLEAN CATCH Performed at [facility], [place] [address] [place], [place] [address]    Special Requests   Final    NONE Performed at [facility], [place] [address] [place], [place] [address]    Culture   Final    NO GROWTH Performed at [facility], [place] [address], [place], [place] [address]    Report Status 06/23/2020 FINAL  Final  Respiratory Panel by RT PCR (Flu A&B, Covid) - Nasopharyngeal Swab     Status: None   Collection Time: 06/24/20 10:46 AM   Specimen: Nasopharyngeal Swab  Result Value Ref Range Status   SARS Coronavirus 2 by RT PCR NEGATIVE NEGATIVE Final    Comment: (NOTE) SARS-CoV-2 target nucleic acids are NOT DETECTED.  The SARS-CoV-2 RNA is generally detectable in upper respiratoy specimens during the acute phase of infection. The lowest concentration of SARS-CoV-2 viral copies this assay can detect is 131 copies/mL. A negative result does not preclude SARS-Cov-2 infection and should not be used as the sole basis for treatment or other patient management decisions. A negative result may occur with  improper specimen collection/handling, submission of specimen other than nasopharyngeal swab, presence of viral mutation(s) within the areas targeted by this assay, and inadequate number of viral copies (<131 copies/mL). A negative result must be combined with clinical observations, patient history, and epidemiological information. The expected result is Negative.  Fact Sheet for Patients:  PinkCheek.be  Fact Sheet for  Healthcare Providers:  GravelBags.it  This test is no t yet approved or cleared by the [place] FDA and  has been authorized for detection and/or diagnosis of SARS-CoV-2 by FDA under an Emergency Use Authorization (EUA). This EUA will remain  in effect (meaning this test can be used) for the duration of the COVID-19 declaration under Section 564(b)(1) of the Act, 21 U.S.C. section 360bbb-3(b)(1), unless the authorization is terminated or revoked sooner.     Influenza A by PCR NEGATIVE NEGATIVE Final   Influenza B by PCR NEGATIVE NEGATIVE Final    Comment: (NOTE) The  Xpert Xpress SARS-CoV-2/FLU/RSV assay is intended as an aid in  the diagnosis of influenza from Nasopharyngeal swab specimens and  should not be used as a sole basis for treatment. Nasal washings and  aspirates are unacceptable for Xpert Xpress SARS-CoV-2/FLU/RSV  testing.  Fact Sheet for Patients: PinkCheek.be  Fact Sheet for Healthcare Providers: GravelBags.it  This test is not yet approved or cleared by the [place] FDA and  has been authorized for detection and/or diagnosis of SARS-CoV-2 by  FDA under an Emergency Use Authorization (EUA). This EUA will remain  in effect (meaning this test can be used) for the duration of the  Covid-19 declaration under Section 564(b)(1) of the Act, 21  U.S.C. section 360bbb-3(b)(1), unless the authorization is  terminated or revoked. Performed at [facility], [place] [address] [place], [place] [address]          Radiology Studies: No results found.      Scheduled Meds: . aspirin EC  81 mg Oral Daily  . enoxaparin (LOVENOX) injection  40 mg Subcutaneous Q24H  . folic acid  1 mg Oral Daily  . magic mouthwash w/lidocaine  15 mL Oral QID  . multivitamin with minerals  1 tablet Oral Daily  . nicotine  14 mg Transdermal Daily  . oxybutynin  15 mg Oral QHS   . rosuvastatin  40 mg Oral Daily  . sodium chloride flush  3 mL Intravenous Q12H  . thiamine  100 mg Oral Daily   Continuous Infusions: . chlorproMAZINE (THORAZINE) IV       LOS: 10 days    Time spent: 35 minutes    Irine Seal, MD [facility]   To contact the attending provider between 7A-7P or the covering provider during after hours 7P-7A, please log into the web site [email or web address] and access using universal [place] password for that web site. If you do not have the password, please call the hospital operator.  06/30/2020, 10:40 AM

## 2020-06-30 NOTE — Plan of Care (Signed)

## 2020-06-30 NOTE — TOC Progression Note (Signed)
Transition of Care [facility]) - Progression Note    Patient Details  Name: Jeremiah Garcia MRN: [id number] Date of Birth: [birth year]  Transition of [facility]) CM/SW Contact  Acelyn Basham, Olegario Messier, RN Phone Number: 06/30/2020, 10:01 AM  Clinical Narrative:Received TC from [facility]-they have pending auth for SNF-will need to know if patient was vaccinated-per dtr he was vaccinated in [place]-she is trying to get the proof-this will determine which type of rm he will go to.      Expected Discharge Plan: Skilled Nursing Facility Barriers to Discharge: SNF Authorization Denied, Other (comment) (peer to peer)  Expected Discharge Plan and Services Expected Discharge Plan: Skilled Nursing Facility   Discharge Planning Services: CM Consult Post Acute Care Choice:  (transitional housing) Living arrangements for the past 2 months: Homeless Shelter Expected Discharge Date: 06/29/20                                     Social Determinants of Health (SDOH) Interventions Housing Interventions: [id number] Referral (Homeless in [place].) Transportation Interventions: [id number] Referral (Transportation-bus pass.) Alcohol Brief Interventions/Follow-up: Alcohol Education  Readmission Risk Interventions No flowsheet data found.

## 2020-06-30 NOTE — Progress Notes (Signed)
Physical Therapy Treatment Patient Details Name: Jeremiah Garcia MRN: [id number] DOB: July 18, 1946 Today's Date: 06/30/2020    History of Present Illness Pt admitted with fever and urinary frequency and now with dx of SIRS vs sepsis.  Pt with hx of CVA, ETOH abuse and tremor    PT Comments    General Comments: Pt on phone with daughter who lives in [place].  Pt ststed he has a place to go.  Plans to stay with a friend "Neecy" and her husband.  Gave me an address. Says Ancil Linsey will pick him up.  Gave that info to the nurse.  Pt wanting to get dressed and did so at Supervision level including donning shoes.General Gait Details: Supervision to amb around room using furniture to occassional steady self.  Supervision to amb > 150 feet in hallway with SPC.  No overt LOB.  Good alternating gait with functional speed.Returned to room.  Left pt in recliner.  Awaiting devlivery of a cane then plans to D/C.   Follow Up Recommendations  SNF (pt has made other arrangement to stay with a friend)     Nurse, learning disability    Recommendations for Other Services       Precautions / Restrictions Precautions Precautions: Fall Restrictions Weight Bearing Restrictions: No    Mobility  Bed Mobility Overal bed mobility: Needs Assistance       Supine to sit: Supervision;HOB elevated     General bed mobility comments: pt self able with use of rails and increased time  Transfers Overall transfer level: Needs assistance Equipment used: Straight cane Transfers: Sit to/from Stand;Stand Pivot Transfers Sit to Stand: Supervision Stand pivot transfers: Supervision       General transfer comment: good use of hands to steady self getting off side of bed and seated to recliner  Ambulation/Gait Ambulation/Gait assistance: Supervision Gait Distance (Feet): 155 Feet Assistive device: Straight cane Gait Pattern/deviations: Step-through pattern;Decreased stride length     General Gait  Details: Supervision to amb around room using furniture to occassional steady self.  Supervision to amb > 150 feet in hallway with SPC.  No overt LOB.  Good alternating gait with functional speed.   Stairs             Wheelchair Mobility    Modified Rankin (Stroke Patients Only)       Balance Overall balance assessment: Mild deficits observed, not formally tested                                          Cognition Arousal/Alertness: Awake/alert Behavior During Therapy: WFL for tasks assessed/performed Overall Cognitive Status: Within Functional Limits for tasks assessed                                 General Comments: Pt on phone with daughter who lives in [place].  Pt ststed he has a place to go.  Plans to stay with a friend "Neecy" and her husband.  Gave me an address.  Gave that info to the nurse.  Pt wanting to get dressed and did so at Supervision level including donning shoes.      Exercises      General Comments        Pertinent Vitals/Pain Pain Assessment: No/denies pain    Home Living  Prior Function            PT Goals (current goals can now be found in the care plan section) Acute Rehab PT Goals Patient Stated Goal: Regain IND Progress towards PT goals: Progressing toward goals    Frequency    Min 3X/week      PT Plan Discharge plan needs to be updated    Co-evaluation              AM-PAC PT "6 Clicks" Mobility   Outcome Measure  Help needed turning from your back to your side while in a flat bed without using bedrails?: None Help needed moving from lying on your back to sitting on the side of a flat bed without using bedrails?: None Help needed moving to and from a bed to a chair (including a wheelchair)?: None Help needed standing up from a chair using your arms (e.g., wheelchair or bedside chair)?: None Help needed to walk in hospital room?: None Help needed  climbing 3-5 steps with a railing? : A Little 6 Click Score: 23    End of Session Equipment Utilized During Treatment: Gait belt Activity Tolerance: Patient tolerated treatment well Patient left: in chair;with call bell/phone within reach;with chair alarm set Nurse Communication: Mobility status PT Visit Diagnosis: Difficulty in walking, not elsewhere classified (R26.2);Muscle weakness (generalized) (M62.81);Unsteadiness on feet (R26.81)     Time: 0973-5329 PT Time Calculation (min) (ACUTE ONLY): 18 min  Charges:  $Gait Training: 8-22 mins                     Felecia Shelling  PTA Acute  Rehabilitation Services Pager      [phone] Office      [phone]

## 2020-06-30 NOTE — Progress Notes (Signed)
Pt discharged home today per Dr. Thompson. Pt's IV site D/C'd and WDL. Pt's VSS. Pt provided with home medication list, discharge instructions and prescriptions. Verbalized understanding. Pt left floor via WC in stable condition accompanied by NT. 

## 2020-06-30 NOTE — Progress Notes (Signed)
PHYSICAL THERAPY  Pt back in bed and declined to get up "right now"  Pt did work with OT today.  Pt continues to have galt instability and is a HIGH FALL RISK.  Pt will need ST Rehab at SNF.  Felecia Shelling  PTA Acute  Rehabilitation Services Pager      [phone] Office      [phone]

## 2020-07-15 ENCOUNTER — Inpatient Hospital Stay: Payer: Medicare HMO | Admitting: Family Medicine

## 2020-10-07 DIAGNOSIS — Z720 Tobacco use: Secondary | ICD-10-CM | POA: Diagnosis not present

## 2020-10-07 DIAGNOSIS — E782 Mixed hyperlipidemia: Secondary | ICD-10-CM | POA: Diagnosis not present

## 2020-10-07 DIAGNOSIS — R7303 Prediabetes: Secondary | ICD-10-CM | POA: Diagnosis not present

## 2020-10-07 DIAGNOSIS — I10 Essential (primary) hypertension: Secondary | ICD-10-CM | POA: Diagnosis not present

## 2020-10-07 DIAGNOSIS — E559 Vitamin D deficiency, unspecified: Secondary | ICD-10-CM | POA: Diagnosis not present

## 2020-10-07 DIAGNOSIS — Z8673 Personal history of transient ischemic attack (TIA), and cerebral infarction without residual deficits: Secondary | ICD-10-CM | POA: Diagnosis not present

## 2020-12-15 DIAGNOSIS — Z03818 Encounter for observation for suspected exposure to other biological agents ruled out: Secondary | ICD-10-CM | POA: Diagnosis not present

## 2020-12-15 DIAGNOSIS — Z20822 Contact with and (suspected) exposure to covid-19: Secondary | ICD-10-CM | POA: Diagnosis not present

## 2020-12-29 DIAGNOSIS — Z03818 Encounter for observation for suspected exposure to other biological agents ruled out: Secondary | ICD-10-CM | POA: Diagnosis not present

## 2020-12-29 DIAGNOSIS — Z20822 Contact with and (suspected) exposure to covid-19: Secondary | ICD-10-CM | POA: Diagnosis not present

## 2021-01-07 DIAGNOSIS — Z03818 Encounter for observation for suspected exposure to other biological agents ruled out: Secondary | ICD-10-CM | POA: Diagnosis not present

## 2021-01-07 DIAGNOSIS — Z20822 Contact with and (suspected) exposure to covid-19: Secondary | ICD-10-CM | POA: Diagnosis not present

## 2021-01-13 DIAGNOSIS — Z03818 Encounter for observation for suspected exposure to other biological agents ruled out: Secondary | ICD-10-CM | POA: Diagnosis not present

## 2021-01-13 DIAGNOSIS — Z20822 Contact with and (suspected) exposure to covid-19: Secondary | ICD-10-CM | POA: Diagnosis not present

## 2021-01-21 DIAGNOSIS — Z03818 Encounter for observation for suspected exposure to other biological agents ruled out: Secondary | ICD-10-CM | POA: Diagnosis not present

## 2021-01-21 DIAGNOSIS — Z20822 Contact with and (suspected) exposure to covid-19: Secondary | ICD-10-CM | POA: Diagnosis not present

## 2021-01-27 DIAGNOSIS — Z20822 Contact with and (suspected) exposure to covid-19: Secondary | ICD-10-CM | POA: Diagnosis not present

## 2021-01-27 DIAGNOSIS — Z03818 Encounter for observation for suspected exposure to other biological agents ruled out: Secondary | ICD-10-CM | POA: Diagnosis not present

## 2021-02-03 DIAGNOSIS — Z20822 Contact with and (suspected) exposure to covid-19: Secondary | ICD-10-CM | POA: Diagnosis not present

## 2021-02-03 DIAGNOSIS — Z03818 Encounter for observation for suspected exposure to other biological agents ruled out: Secondary | ICD-10-CM | POA: Diagnosis not present

## 2021-02-08 DIAGNOSIS — Z720 Tobacco use: Secondary | ICD-10-CM | POA: Diagnosis not present

## 2021-02-08 DIAGNOSIS — I10 Essential (primary) hypertension: Secondary | ICD-10-CM | POA: Diagnosis not present

## 2021-02-08 DIAGNOSIS — E559 Vitamin D deficiency, unspecified: Secondary | ICD-10-CM | POA: Diagnosis not present

## 2021-02-08 DIAGNOSIS — R7303 Prediabetes: Secondary | ICD-10-CM | POA: Diagnosis not present

## 2021-02-08 DIAGNOSIS — Z8673 Personal history of transient ischemic attack (TIA), and cerebral infarction without residual deficits: Secondary | ICD-10-CM | POA: Diagnosis not present

## 2021-02-08 DIAGNOSIS — E782 Mixed hyperlipidemia: Secondary | ICD-10-CM | POA: Diagnosis not present

## 2021-02-09 DIAGNOSIS — Z20822 Contact with and (suspected) exposure to covid-19: Secondary | ICD-10-CM | POA: Diagnosis not present

## 2021-02-09 DIAGNOSIS — Z03818 Encounter for observation for suspected exposure to other biological agents ruled out: Secondary | ICD-10-CM | POA: Diagnosis not present

## 2021-03-18 DIAGNOSIS — Z20822 Contact with and (suspected) exposure to covid-19: Secondary | ICD-10-CM | POA: Diagnosis not present

## 2021-03-18 DIAGNOSIS — Z03818 Encounter for observation for suspected exposure to other biological agents ruled out: Secondary | ICD-10-CM | POA: Diagnosis not present

## 2021-03-28 DIAGNOSIS — Z20822 Contact with and (suspected) exposure to covid-19: Secondary | ICD-10-CM | POA: Diagnosis not present

## 2021-03-28 DIAGNOSIS — Z03818 Encounter for observation for suspected exposure to other biological agents ruled out: Secondary | ICD-10-CM | POA: Diagnosis not present

## 2021-04-13 DIAGNOSIS — Z20822 Contact with and (suspected) exposure to covid-19: Secondary | ICD-10-CM | POA: Diagnosis not present

## 2021-04-13 DIAGNOSIS — Z03818 Encounter for observation for suspected exposure to other biological agents ruled out: Secondary | ICD-10-CM | POA: Diagnosis not present

## 2021-04-20 DIAGNOSIS — Z20822 Contact with and (suspected) exposure to covid-19: Secondary | ICD-10-CM | POA: Diagnosis not present

## 2021-04-28 DIAGNOSIS — Z20822 Contact with and (suspected) exposure to covid-19: Secondary | ICD-10-CM | POA: Diagnosis not present

## 2021-04-28 DIAGNOSIS — Z03818 Encounter for observation for suspected exposure to other biological agents ruled out: Secondary | ICD-10-CM | POA: Diagnosis not present

## 2021-05-06 DIAGNOSIS — E559 Vitamin D deficiency, unspecified: Secondary | ICD-10-CM | POA: Diagnosis not present

## 2021-05-06 DIAGNOSIS — R7303 Prediabetes: Secondary | ICD-10-CM | POA: Diagnosis not present

## 2021-05-06 DIAGNOSIS — Z8673 Personal history of transient ischemic attack (TIA), and cerebral infarction without residual deficits: Secondary | ICD-10-CM | POA: Diagnosis not present

## 2021-05-06 DIAGNOSIS — Z720 Tobacco use: Secondary | ICD-10-CM | POA: Diagnosis not present

## 2021-05-06 DIAGNOSIS — E782 Mixed hyperlipidemia: Secondary | ICD-10-CM | POA: Diagnosis not present

## 2021-05-06 DIAGNOSIS — Z0001 Encounter for general adult medical examination with abnormal findings: Secondary | ICD-10-CM | POA: Diagnosis not present

## 2021-05-06 DIAGNOSIS — I10 Essential (primary) hypertension: Secondary | ICD-10-CM | POA: Diagnosis not present

## 2021-05-09 ENCOUNTER — Emergency Department (HOSPITAL_COMMUNITY): Payer: Medicare Other

## 2021-05-09 ENCOUNTER — Emergency Department (HOSPITAL_COMMUNITY)
Admission: EM | Admit: 2021-05-09 | Discharge: 2021-05-09 | Disposition: A | Discharge status: Home / Self Care | Payer: Medicare Other | Attending: Emergency Medicine | Admitting: Emergency Medicine

## 2021-05-09 DIAGNOSIS — R2681 Unsteadiness on feet: Secondary | ICD-10-CM | POA: Diagnosis not present

## 2021-05-09 DIAGNOSIS — R269 Unspecified abnormalities of gait and mobility: Secondary | ICD-10-CM

## 2021-05-09 DIAGNOSIS — R42 Dizziness and giddiness: Secondary | ICD-10-CM | POA: Diagnosis not present

## 2021-05-09 DIAGNOSIS — F17210 Nicotine dependence, cigarettes, uncomplicated: Secondary | ICD-10-CM | POA: Insufficient documentation

## 2021-05-09 DIAGNOSIS — I959 Hypotension, unspecified: Secondary | ICD-10-CM | POA: Diagnosis not present

## 2021-05-09 DIAGNOSIS — R296 Repeated falls: Secondary | ICD-10-CM | POA: Insufficient documentation

## 2021-05-09 DIAGNOSIS — Z7982 Long term (current) use of aspirin: Secondary | ICD-10-CM | POA: Insufficient documentation

## 2021-05-09 DIAGNOSIS — Z79899 Other long term (current) drug therapy: Secondary | ICD-10-CM | POA: Diagnosis not present

## 2021-05-09 DIAGNOSIS — I6782 Cerebral ischemia: Secondary | ICD-10-CM | POA: Diagnosis not present

## 2021-05-09 DIAGNOSIS — I10 Essential (primary) hypertension: Secondary | ICD-10-CM | POA: Insufficient documentation

## 2021-05-09 LAB — CBC WITH DIFFERENTIAL/PLATELET
Abs Immature Granulocytes: 0.01 10*3/uL (ref 0.00–0.07)
Basophils Absolute: 0 10*3/uL (ref 0.0–0.1)
Basophils Relative: 0 %
Eosinophils Absolute: 0.1 10*3/uL (ref 0.0–0.5)
Eosinophils Relative: 2 %
HCT: 45.9 % (ref 39.0–52.0)
Hemoglobin: 14 g/dL (ref 13.0–17.0)
Immature Granulocytes: 0 %
Lymphocytes Relative: 30 %
Lymphs Abs: 1.6 10*3/uL (ref 0.7–4.0)
MCH: 26.1 pg (ref 26.0–34.0)
MCHC: 30.5 g/dL (ref 30.0–36.0)
MCV: 85.6 fL (ref 80.0–100.0)
Monocytes Absolute: 0.8 10*3/uL (ref 0.1–1.0)
Monocytes Relative: 15 %
Neutro Abs: 2.8 10*3/uL (ref 1.7–7.7)
Neutrophils Relative %: 53 %
Platelets: 230 10*3/uL (ref 150–400)
RBC: 5.36 MIL/uL (ref 4.22–5.81)
RDW: 17.1 % — ABNORMAL HIGH (ref 11.5–15.5)
WBC: 5.3 10*3/uL (ref 4.0–10.5)
nRBC: 0 % (ref 0.0–0.2)

## 2021-05-09 LAB — BASIC METABOLIC PANEL
Anion gap: 7 (ref 5–15)
BUN: 18 mg/dL (ref 8–23)
CO2: 28 mmol/L (ref 22–32)
Calcium: 8.9 mg/dL (ref 8.9–10.3)
Chloride: 106 mmol/L (ref 98–111)
Creatinine, Ser: 1.19 mg/dL (ref 0.61–1.24)
GFR, Estimated: >60 mL/min (ref >60)
Glucose, Bld: 82 mg/dL (ref 70–99)
Potassium: 4.8 mmol/L (ref 3.5–5.1)
Sodium: 141 mmol/L (ref 135–145)

## 2021-05-09 NOTE — ED Provider Notes (Signed)
[facility]-EMERGENCY DEPT Provider Note   CSN: [id number] Arrival date & time: 05/09/21  1532     History Chief Complaint  Patient presents with   Gait Problem    Jeremiah Garcia is a 75 y.o. male.  Patient presents to ER chief complaint of unsteady gait.  He states that this is been going on for couple of weeks.  He felt like it was worse in the last 3 to 4 days.  He spoke with his doctor trying to arrange for a walker but has not been able to access one yet so presents to the ER.  He had a ground-level fall 3 days ago as well.  Currently denies any headache or neck pain.  Denies back pain or extremity pain.  Denies any bowel or bladder dysfunction.  No reports of fevers or cough or vomiting or diarrhea.      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  cea   CVA (cerebral infarction) 2011   with Left upper extremity weakness   Hyperlipidemia    Hypertension    Stroke [facility] June 2011   Left side weakness    Patient Active Problem List   Diagnosis Date Noted   Community acquired pneumonia    Bowel and bladder incontinence    Urinary urgency    Sepsis (HCC) 06/19/2020   Postural dizziness with presyncope 06/19/2020   Alcohol abuse 06/19/2020   Aftercare following surgery of the circulatory system, NEC 01/21/2014   EMPHYSEMA 12/09/2010   ACUTE CYSTITIS 11/16/2010   COUGH 11/16/2010   DENTAL CARIES 08/22/2010   Hyperlipidemia 05/17/2010   GLUCOSE INTOLERANCE 05/16/2010   Essential hypertension, benign 05/16/2010   TOBACCO ABUSE 03/14/2010   CAROTID ARTERY STENOSIS, 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    Past Surgical History:  Procedure Laterality Date   CAROTID ENDARTERECTOMY  March 02, 2010   RIGHT cea   HERNIA REPAIR  2009       Family History  Problem Relation Age of Onset   Hypertension Mother    Heart disease Father        Before age 83   Heart attack Father    Diabetes Brother     Social History    Tobacco Use   Smoking status: Every Day    Packs/day: 0.50    Years: 12.00    Pack years: 6.00    Types: Cigarettes   Smokeless tobacco: Never  Substance Use Topics   Alcohol use: Yes    Alcohol/week: 16.0 standard drinks    Types: 12 Glasses of wine, 4 Shots of liquor per week   Drug use: No    Home Medications Prior to Admission medications   Medication Sig Start Date End Date Taking? Authorizing Provider  amLODipine (NORVASC) 10 MG tablet Take 10 mg by mouth daily. 05/06/21   [provider]  aspirin EC 81 MG tablet Take 81 mg by mouth daily.    [provider]  fish oil-omega-3 fatty acids 1000 MG capsule Take 1 g by mouth 2 (two) times daily.     [provider]  folic acid (FOLVITE) 1 MG tablet Take 1 tablet (1 mg total) by mouth daily. 06/25/20   Noralee Stain, DO  lidocaine (XYLOCAINE) 2 % solution Use as directed 15 mLs in the mouth or throat as needed for mouth pain.    [provider]  lisinopril-hydrochlorothiazide (ZESTORETIC) 20-25 MG tablet Take 1 tablet by mouth  daily. 05/06/21   [provider]  Multiple Vitamin (MULTIVITAMIN WITH MINERALS) TABS tablet Take 1 tablet by mouth daily. 06/25/20   Noralee Stain, DO  oxybutynin (DITROPAN XL) 15 MG 24 hr tablet Take 1 tablet (15 mg total) by mouth at bedtime. 06/24/20   Noralee Stain, DO  rosuvastatin (CRESTOR) 40 MG tablet Take 1 tablet (40 mg total) by mouth daily. Patient not taking: Reported on 06/19/2020 11/01/12   Lars Mage, MD  thiamine 100 MG tablet Take 1 tablet (100 mg total) by mouth daily. 06/25/20   Noralee Stain, DO    Allergies    Patient has no known allergies.  Review of Systems   Review of Systems  Constitutional:  Negative for fever.  HENT:  Negative for ear pain and sore throat.   Eyes:  Negative for pain.  Respiratory:  Negative for cough.   Cardiovascular:  Negative for chest pain.  Gastrointestinal:  Negative for abdominal pain.  Genitourinary:   Negative for flank pain.  Musculoskeletal:  Negative for back pain.  Skin:  Negative for color change and rash.  Neurological:  Negative for syncope.  All other systems reviewed and are negative.  Physical Exam Updated Vital Signs BP (!) 152/83   Pulse 74   Temp 98.6 F (37 C) (Oral)   Resp 18   Ht 6\' 1"  (1.854 m)   Wt 81.6 kg   SpO2 100%   BMI 23.75 kg/m   Physical Exam Constitutional:      Appearance: He is well-developed.  HENT:     Head: Normocephalic.     Nose: Nose normal.  Eyes:     Extraocular Movements: Extraocular movements intact.  Cardiovascular:     Rate and Rhythm: Normal rate.  Pulmonary:     Effort: Pulmonary effort is normal.  Musculoskeletal:     Comments: No C or T or L-spine midline step-offs or tenderness noted.  Patient able to range his neck and torso without pain or discomfort.  He is ambulatory here without assistance; although he does have a guarded gait he is able to walk without any assistance.  Skin:    Coloration: Skin is not jaundiced.  Neurological:     General: No focal deficit present.     Mental Status: He is alert and oriented to person, place, and time. Mental status is at baseline.     Comments: 5/5 strength all extremities.  Cranial nerves II through XII intact.  Finger-nose intact.  Heel-to-shin intact.    ED Results / Procedures / Treatments   Labs (all labs ordered are listed, but only abnormal results are displayed) Labs Reviewed  CBC WITH DIFFERENTIAL/PLATELET - Abnormal; Notable for the following components:      Result Value   RDW 17.1 (*)    All other components within normal limits  BASIC METABOLIC PANEL    EKG EKG Interpretation  Date/Time:  Monday May 09 2021 16:01:13 EDT Ventricular Rate:  79 PR Interval:  193 QRS Duration: 93 QT Interval:  387 QTC Calculation: 444 R Axis:   30 Text Interpretation: Sinus rhythm Atrial premature complex Probable left atrial enlargement Abnormal R-wave progression,  early transition Minimal ST elevation, inferior leads Confirmed by [id number] ([id number]) on 05/09/2021 4:19:11 PM  Radiology CT Head Wo Contrast  Result Date: 05/09/2021 CLINICAL DATA:  Dizziness.  Unsteady gait. EXAM: CT HEAD WITHOUT CONTRAST TECHNIQUE: Contiguous axial images were obtained from the base of the skull through the vertex without intravenous contrast. COMPARISON:  10/12/2012  FINDINGS: Brain: There is no evidence of an acute infarct, intracranial hemorrhage, mass, midline shift, or extra-axial fluid collection. Extensive encephalomalacia is again seen in the right MCA territory related to a remote infarct. There is associated ex vacuo dilatation of the right lateral ventricle. Hypodensities in the white matter of the left cerebral hemisphere of mildly progressed and are nonspecific but compatible with mild chronic small vessel ischemic disease. There is chronic coarse/dystrophic calcification in the pons which is stable to slightly increased without associated edema or mass effect. Vascular: Calcified atherosclerosis at the skull base. No hyperdense vessel. Skull: No fracture suspicious osseous lesion. Sinuses/Orbits: Mild posterior right ethmoid air cell opacification. Clear mastoid air cells. Unremarkable orbits. Other: None. IMPRESSION: 1. No evidence of acute intracranial abnormality. 2. Large remote right MCA infarct. 3. Mild chronic small vessel ischemic disease. Electronically Signed   By: Sebastian Ache M.D.   On: 05/09/2021 17:13    Procedures Procedures   Medications Ordered in ED Medications - No data to display  ED Course  I have reviewed the triage vital signs and the nursing notes.  Pertinent labs & imaging results that were available during my care of the patient were reviewed by me and considered in my medical decision making (see chart for details).    MDM Rules/Calculators/A&P                           Patient has no focal deficit at this time.  Imaging and lab  studies were ordered.  Patient has no focal neurodeficit labs unremarkable imaging shows no acute pathology but shows evidence of old stroke.  Advised the patient to follow-up with his doctor regarding these findings.  Advising to follow-up with his doctor in outpatient basis within the week.  Advised him that he can buy walker at [facility] or [employer] or health supply store.  Advising immediate return for new injuries fevers pain or any additional concerns.   Final Clinical Impression(s) / ED Diagnoses Final diagnoses:  Frequent falls  Gait disturbance    Rx / DC Orders ED Discharge Orders     None        Cheryll Cockayne, MD 05/09/21 1859

## 2021-05-09 NOTE — ED Triage Notes (Signed)
Pt to ED via EMS c/o unsteady gait that started Friday. Scene by MD for same problem on Friday and requesting a walker to get around, pt did not get a walker, reports gait remains unsteady progressively getting worse sense. HX Dementia; orientation at baseline  Last VS: 106/76, 99%RA P 80. No medications given by EMS.

## 2021-05-09 NOTE — Discharge Instructions (Addendum)
Call your primary care doctor or specialist as discussed in the next 2-3 days.   Return immediately back to the ER if:  Your symptoms worsen within the next 12-24 hours. You develop new symptoms such as new fevers, persistent vomiting, new pain, shortness of breath, or new weakness or numbness, or if you have any other concerns.  

## 2021-05-09 NOTE — ED Notes (Signed)
Pt d/c home per MD order. Discharge summary reviewed with pt, pt verbalizes understanding. No s/s of acute distress noted at discharge.  

## 2021-05-09 NOTE — ED Notes (Signed)
Pt denies dizziness.

## 2021-05-09 NOTE — ED Notes (Signed)
Pt transported to CT ?

## 2021-05-29 DIAGNOSIS — I10 Essential (primary) hypertension: Secondary | ICD-10-CM | POA: Diagnosis not present

## 2021-05-29 DIAGNOSIS — Z8673 Personal history of transient ischemic attack (TIA), and cerebral infarction without residual deficits: Secondary | ICD-10-CM | POA: Diagnosis not present

## 2021-05-29 DIAGNOSIS — Z7982 Long term (current) use of aspirin: Secondary | ICD-10-CM | POA: Diagnosis not present

## 2021-05-29 DIAGNOSIS — Z20822 Contact with and (suspected) exposure to covid-19: Secondary | ICD-10-CM | POA: Diagnosis not present

## 2021-05-29 DIAGNOSIS — E785 Hyperlipidemia, unspecified: Secondary | ICD-10-CM | POA: Diagnosis not present

## 2021-05-29 DIAGNOSIS — E860 Dehydration: Secondary | ICD-10-CM | POA: Diagnosis not present

## 2021-05-29 DIAGNOSIS — N189 Chronic kidney disease, unspecified: Secondary | ICD-10-CM | POA: Diagnosis not present

## 2021-05-29 DIAGNOSIS — F17210 Nicotine dependence, cigarettes, uncomplicated: Secondary | ICD-10-CM | POA: Diagnosis not present

## 2021-05-29 DIAGNOSIS — R296 Repeated falls: Secondary | ICD-10-CM | POA: Diagnosis not present

## 2021-05-29 DIAGNOSIS — Z79899 Other long term (current) drug therapy: Secondary | ICD-10-CM | POA: Diagnosis not present

## 2021-05-29 DIAGNOSIS — M545 Low back pain, unspecified: Secondary | ICD-10-CM | POA: Diagnosis not present

## 2021-05-29 DIAGNOSIS — N179 Acute kidney failure, unspecified: Secondary | ICD-10-CM | POA: Diagnosis not present

## 2021-05-29 DIAGNOSIS — S32000A Wedge compression fracture of unspecified lumbar vertebra, initial encounter for closed fracture: Secondary | ICD-10-CM | POA: Diagnosis not present

## 2021-05-29 DIAGNOSIS — I959 Hypotension, unspecified: Secondary | ICD-10-CM | POA: Diagnosis not present

## 2021-05-29 DIAGNOSIS — I129 Hypertensive chronic kidney disease with stage 1 through stage 4 chronic kidney disease, or unspecified chronic kidney disease: Secondary | ICD-10-CM | POA: Diagnosis not present

## 2021-05-29 DIAGNOSIS — M5136 Other intervertebral disc degeneration, lumbar region: Secondary | ICD-10-CM | POA: Diagnosis not present

## 2021-06-02 DIAGNOSIS — I10 Essential (primary) hypertension: Secondary | ICD-10-CM | POA: Diagnosis not present

## 2021-06-02 DIAGNOSIS — N179 Acute kidney failure, unspecified: Secondary | ICD-10-CM | POA: Diagnosis not present

## 2021-06-03 DIAGNOSIS — I10 Essential (primary) hypertension: Secondary | ICD-10-CM | POA: Diagnosis not present

## 2021-06-03 DIAGNOSIS — N179 Acute kidney failure, unspecified: Secondary | ICD-10-CM | POA: Diagnosis not present

## 2021-06-17 DIAGNOSIS — S32039D Unspecified fracture of third lumbar vertebra, subsequent encounter for fracture with routine healing: Secondary | ICD-10-CM | POA: Diagnosis not present

## 2021-06-17 DIAGNOSIS — R9389 Abnormal findings on diagnostic imaging of other specified body structures: Secondary | ICD-10-CM | POA: Diagnosis not present

## 2021-06-17 DIAGNOSIS — Z8673 Personal history of transient ischemic attack (TIA), and cerebral infarction without residual deficits: Secondary | ICD-10-CM | POA: Diagnosis not present

## 2021-06-17 DIAGNOSIS — S32030D Wedge compression fracture of third lumbar vertebra, subsequent encounter for fracture with routine healing: Secondary | ICD-10-CM | POA: Diagnosis not present

## 2021-06-17 DIAGNOSIS — I6782 Cerebral ischemia: Secondary | ICD-10-CM | POA: Diagnosis not present

## 2021-06-17 DIAGNOSIS — I10 Essential (primary) hypertension: Secondary | ICD-10-CM | POA: Diagnosis not present

## 2021-06-17 DIAGNOSIS — R918 Other nonspecific abnormal finding of lung field: Secondary | ICD-10-CM | POA: Diagnosis not present

## 2021-06-17 DIAGNOSIS — F17210 Nicotine dependence, cigarettes, uncomplicated: Secondary | ICD-10-CM | POA: Diagnosis not present

## 2021-06-17 DIAGNOSIS — S32038A Other fracture of third lumbar vertebra, initial encounter for closed fracture: Secondary | ICD-10-CM | POA: Diagnosis not present

## 2021-06-17 DIAGNOSIS — G319 Degenerative disease of nervous system, unspecified: Secondary | ICD-10-CM | POA: Diagnosis not present

## 2021-06-17 DIAGNOSIS — E7800 Pure hypercholesterolemia, unspecified: Secondary | ICD-10-CM | POA: Diagnosis not present

## 2021-06-18 DIAGNOSIS — R918 Other nonspecific abnormal finding of lung field: Secondary | ICD-10-CM | POA: Diagnosis not present

## 2021-06-18 DIAGNOSIS — S32038A Other fracture of third lumbar vertebra, initial encounter for closed fracture: Secondary | ICD-10-CM | POA: Diagnosis not present

## 2021-06-18 DIAGNOSIS — S32030D Wedge compression fracture of third lumbar vertebra, subsequent encounter for fracture with routine healing: Secondary | ICD-10-CM | POA: Diagnosis not present

## 2021-06-18 DIAGNOSIS — R9389 Abnormal findings on diagnostic imaging of other specified body structures: Secondary | ICD-10-CM | POA: Diagnosis not present

## 2021-06-22 DIAGNOSIS — M549 Dorsalgia, unspecified: Secondary | ICD-10-CM | POA: Diagnosis not present

## 2021-06-22 DIAGNOSIS — S32000A Wedge compression fracture of unspecified lumbar vertebra, initial encounter for closed fracture: Secondary | ICD-10-CM | POA: Diagnosis not present

## 2021-06-30 DIAGNOSIS — Z0001 Encounter for general adult medical examination with abnormal findings: Secondary | ICD-10-CM | POA: Diagnosis not present

## 2021-06-30 DIAGNOSIS — I10 Essential (primary) hypertension: Secondary | ICD-10-CM | POA: Diagnosis not present

## 2021-06-30 DIAGNOSIS — Z23 Encounter for immunization: Secondary | ICD-10-CM | POA: Diagnosis not present

## 2021-06-30 DIAGNOSIS — E785 Hyperlipidemia, unspecified: Secondary | ICD-10-CM | POA: Diagnosis not present

## 2021-07-21 DIAGNOSIS — R079 Chest pain, unspecified: Secondary | ICD-10-CM | POA: Diagnosis not present

## 2021-07-21 DIAGNOSIS — Z20822 Contact with and (suspected) exposure to covid-19: Secondary | ICD-10-CM | POA: Diagnosis not present

## 2021-07-21 DIAGNOSIS — M6281 Muscle weakness (generalized): Secondary | ICD-10-CM | POA: Diagnosis not present

## 2021-07-21 DIAGNOSIS — R2689 Other abnormalities of gait and mobility: Secondary | ICD-10-CM | POA: Diagnosis not present

## 2021-07-21 DIAGNOSIS — Z743 Need for continuous supervision: Secondary | ICD-10-CM | POA: Diagnosis not present

## 2021-07-21 DIAGNOSIS — E119 Type 2 diabetes mellitus without complications: Secondary | ICD-10-CM | POA: Diagnosis not present

## 2021-07-21 DIAGNOSIS — E785 Hyperlipidemia, unspecified: Secondary | ICD-10-CM | POA: Diagnosis not present

## 2021-07-21 DIAGNOSIS — R531 Weakness: Secondary | ICD-10-CM | POA: Diagnosis not present

## 2021-07-21 DIAGNOSIS — N179 Acute kidney failure, unspecified: Secondary | ICD-10-CM | POA: Diagnosis not present

## 2021-07-21 DIAGNOSIS — E7800 Pure hypercholesterolemia, unspecified: Secondary | ICD-10-CM | POA: Diagnosis not present

## 2021-07-21 DIAGNOSIS — Z736 Limitation of activities due to disability: Secondary | ICD-10-CM | POA: Diagnosis not present

## 2021-07-21 DIAGNOSIS — R109 Unspecified abdominal pain: Secondary | ICD-10-CM | POA: Diagnosis not present

## 2021-07-21 DIAGNOSIS — I10 Essential (primary) hypertension: Secondary | ICD-10-CM | POA: Diagnosis not present

## 2021-07-21 DIAGNOSIS — Z9181 History of falling: Secondary | ICD-10-CM | POA: Diagnosis not present

## 2021-07-21 DIAGNOSIS — W108XXA Fall (on) (from) other stairs and steps, initial encounter: Secondary | ICD-10-CM | POA: Diagnosis not present

## 2021-07-21 DIAGNOSIS — R279 Unspecified lack of coordination: Secondary | ICD-10-CM | POA: Diagnosis not present

## 2021-07-21 DIAGNOSIS — R296 Repeated falls: Secondary | ICD-10-CM | POA: Diagnosis not present

## 2021-07-21 DIAGNOSIS — W010XXD Fall on same level from slipping, tripping and stumbling without subsequent striking against object, subsequent encounter: Secondary | ICD-10-CM | POA: Diagnosis not present

## 2021-07-21 DIAGNOSIS — F17210 Nicotine dependence, cigarettes, uncomplicated: Secondary | ICD-10-CM | POA: Diagnosis not present

## 2021-07-21 DIAGNOSIS — Z1159 Encounter for screening for other viral diseases: Secondary | ICD-10-CM | POA: Diagnosis not present

## 2021-07-21 DIAGNOSIS — R251 Tremor, unspecified: Secondary | ICD-10-CM | POA: Diagnosis not present

## 2021-07-21 DIAGNOSIS — Z79899 Other long term (current) drug therapy: Secondary | ICD-10-CM | POA: Diagnosis not present

## 2021-07-28 DIAGNOSIS — E785 Hyperlipidemia, unspecified: Secondary | ICD-10-CM | POA: Diagnosis not present

## 2021-07-28 DIAGNOSIS — Z743 Need for continuous supervision: Secondary | ICD-10-CM | POA: Diagnosis not present

## 2021-07-28 DIAGNOSIS — N179 Acute kidney failure, unspecified: Secondary | ICD-10-CM | POA: Diagnosis not present

## 2021-07-28 DIAGNOSIS — E119 Type 2 diabetes mellitus without complications: Secondary | ICD-10-CM | POA: Diagnosis not present

## 2021-07-28 DIAGNOSIS — R531 Weakness: Secondary | ICD-10-CM | POA: Diagnosis not present

## 2021-07-28 DIAGNOSIS — Z736 Limitation of activities due to disability: Secondary | ICD-10-CM | POA: Diagnosis not present

## 2021-07-28 DIAGNOSIS — F17210 Nicotine dependence, cigarettes, uncomplicated: Secondary | ICD-10-CM | POA: Diagnosis not present

## 2021-07-28 DIAGNOSIS — I10 Essential (primary) hypertension: Secondary | ICD-10-CM | POA: Diagnosis not present

## 2021-07-28 DIAGNOSIS — R296 Repeated falls: Secondary | ICD-10-CM | POA: Diagnosis not present

## 2021-07-28 DIAGNOSIS — M6281 Muscle weakness (generalized): Secondary | ICD-10-CM | POA: Diagnosis not present

## 2021-07-28 DIAGNOSIS — Z1159 Encounter for screening for other viral diseases: Secondary | ICD-10-CM | POA: Diagnosis not present

## 2021-07-28 DIAGNOSIS — W010XXD Fall on same level from slipping, tripping and stumbling without subsequent striking against object, subsequent encounter: Secondary | ICD-10-CM | POA: Diagnosis not present

## 2021-07-28 DIAGNOSIS — R279 Unspecified lack of coordination: Secondary | ICD-10-CM | POA: Diagnosis not present

## 2021-08-01 DIAGNOSIS — M6281 Muscle weakness (generalized): Secondary | ICD-10-CM | POA: Diagnosis not present

## 2021-08-01 DIAGNOSIS — E785 Hyperlipidemia, unspecified: Secondary | ICD-10-CM | POA: Diagnosis not present

## 2021-08-01 DIAGNOSIS — I10 Essential (primary) hypertension: Secondary | ICD-10-CM | POA: Diagnosis not present

## 2021-08-05 DIAGNOSIS — E785 Hyperlipidemia, unspecified: Secondary | ICD-10-CM | POA: Diagnosis not present

## 2021-08-05 DIAGNOSIS — I10 Essential (primary) hypertension: Secondary | ICD-10-CM | POA: Diagnosis not present

## 2021-08-05 DIAGNOSIS — E119 Type 2 diabetes mellitus without complications: Secondary | ICD-10-CM | POA: Diagnosis not present

## 2021-08-05 DIAGNOSIS — M6281 Muscle weakness (generalized): Secondary | ICD-10-CM | POA: Diagnosis not present

## 2021-08-13 DIAGNOSIS — M6281 Muscle weakness (generalized): Secondary | ICD-10-CM | POA: Diagnosis not present

## 2021-08-13 DIAGNOSIS — I10 Essential (primary) hypertension: Secondary | ICD-10-CM | POA: Diagnosis not present

## 2021-08-13 DIAGNOSIS — E785 Hyperlipidemia, unspecified: Secondary | ICD-10-CM | POA: Diagnosis not present

## 2021-08-13 DIAGNOSIS — W010XXD Fall on same level from slipping, tripping and stumbling without subsequent striking against object, subsequent encounter: Secondary | ICD-10-CM | POA: Diagnosis not present

## 2021-08-15 DIAGNOSIS — I10 Essential (primary) hypertension: Secondary | ICD-10-CM | POA: Diagnosis not present

## 2021-08-15 DIAGNOSIS — E119 Type 2 diabetes mellitus without complications: Secondary | ICD-10-CM | POA: Diagnosis not present

## 2021-08-15 DIAGNOSIS — E785 Hyperlipidemia, unspecified: Secondary | ICD-10-CM | POA: Diagnosis not present

## 2021-08-15 DIAGNOSIS — M6281 Muscle weakness (generalized): Secondary | ICD-10-CM | POA: Diagnosis not present

## 2022-05-05 IMAGING — CT CT ABD-PELV W/O CM
2 of 4 series · 16 of 46 positions shown, 18 images · non-contrast
Comparison: 10/30/2013

CLINICAL DATA: Flank pain, urinary frequency

EXAM:
CT ABDOMEN AND PELVIS WITHOUT CONTRAST
TECHNIQUE: Multidetector CT imaging of the abdomen and pelvis was performed
following the standard protocol without IV contrast.

[Series 2: axial st · axial · 0.76mm/px · z∈[+1095,+1460]mm · 13 of 83 slices shown, 15 images]
[im 5/83  soft-tissue]
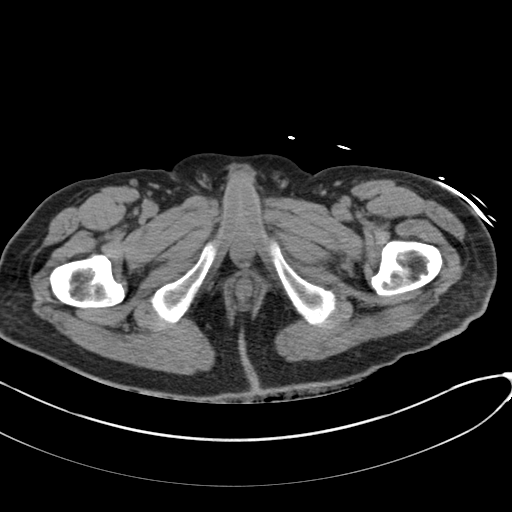
[im 5/83  bone]
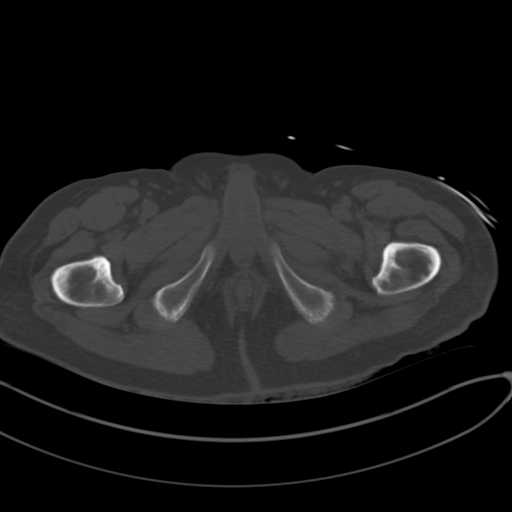
[im 10/83  soft-tissue]
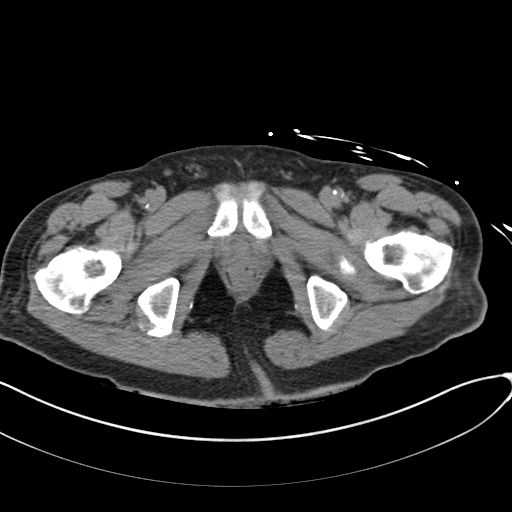
[im 20/83  soft-tissue]
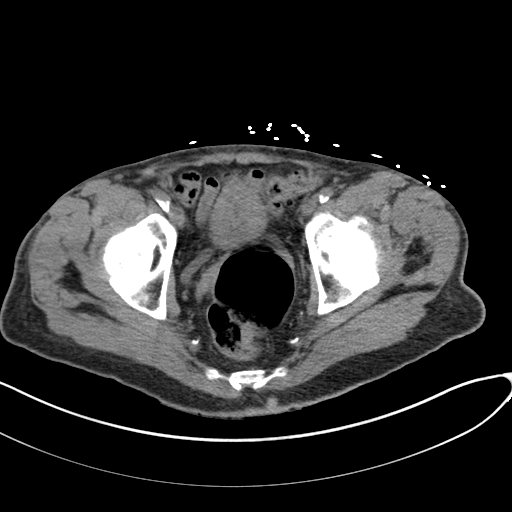
[im 25/83  soft-tissue]
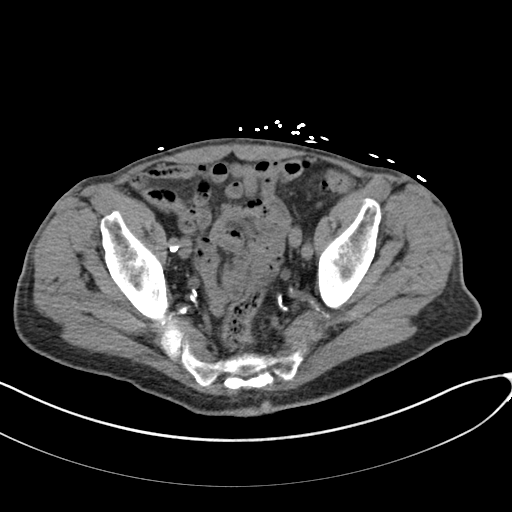
[im 29/83  soft-tissue]
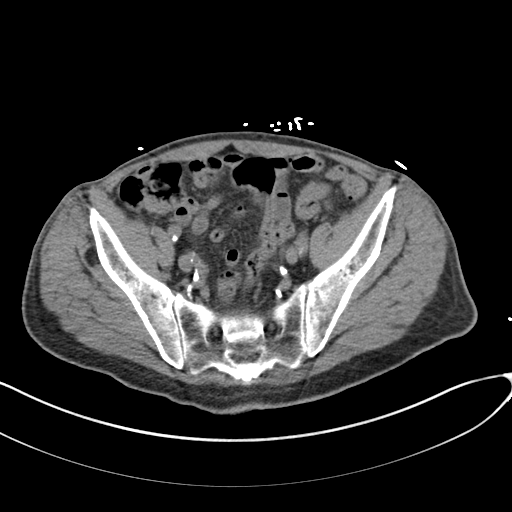
[im 34/83  soft-tissue]
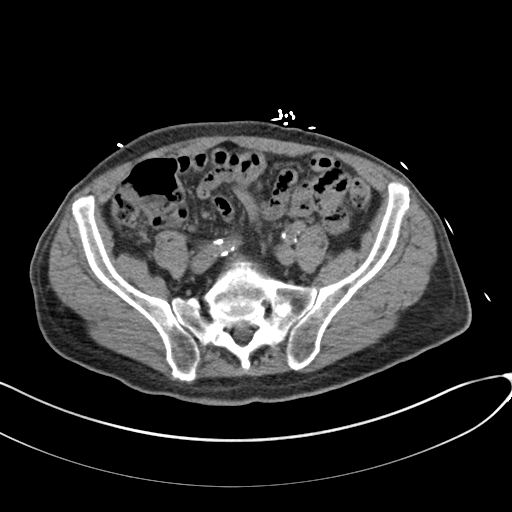
[im 44/83  soft-tissue]
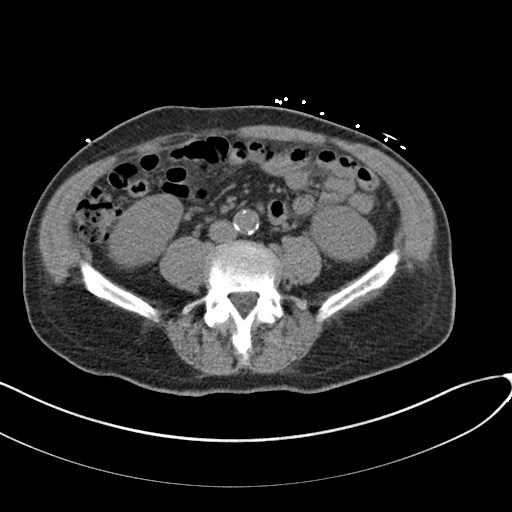
[im 49/83  soft-tissue]
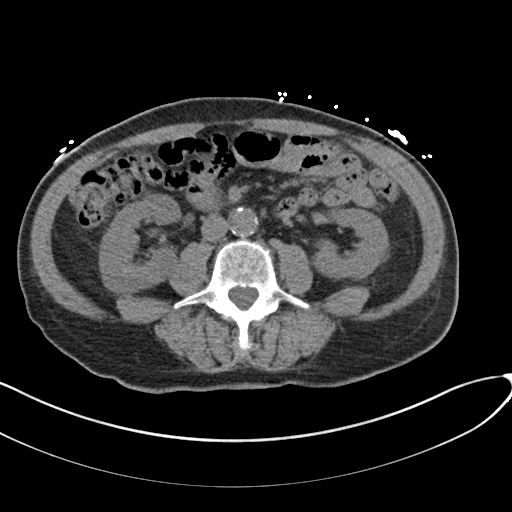
[im 54/83  soft-tissue]
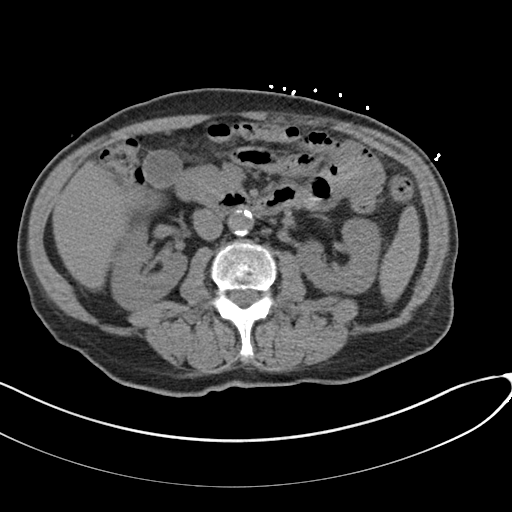
[im 54/83  bone]
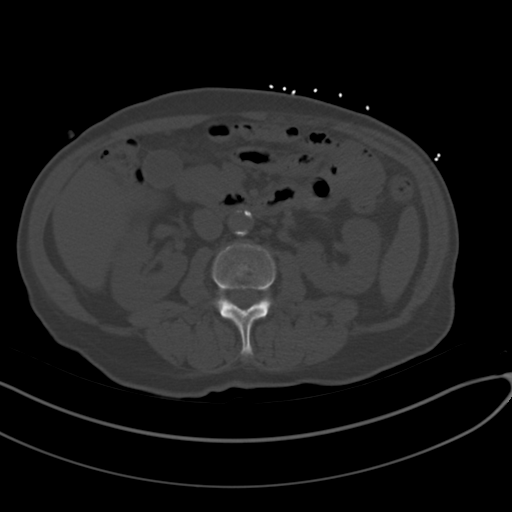
[im 58/83  soft-tissue]
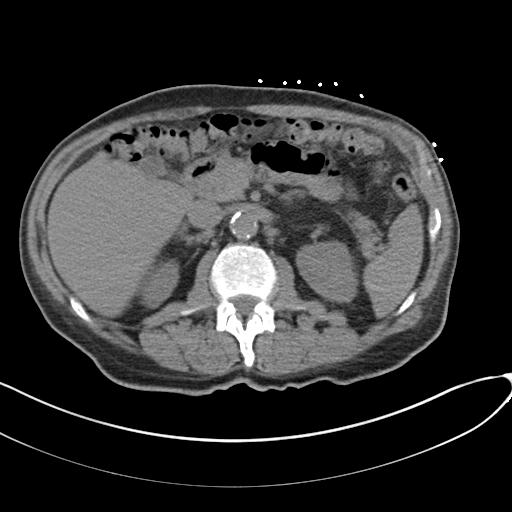
[im 63/83  soft-tissue]
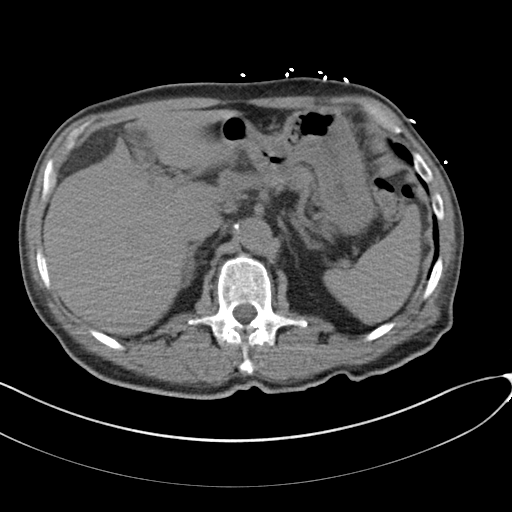
[im 73/83  soft-tissue]
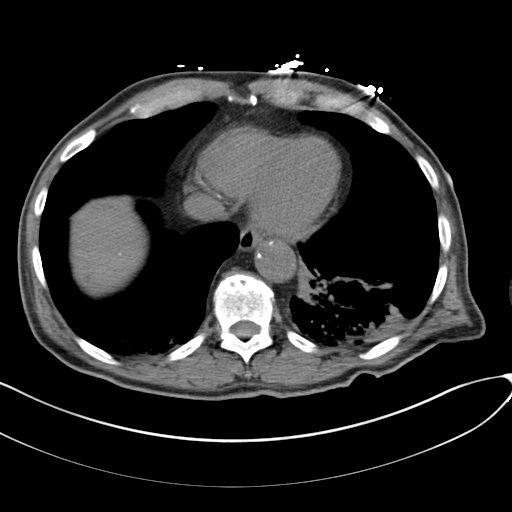
[im 78/83  soft-tissue]
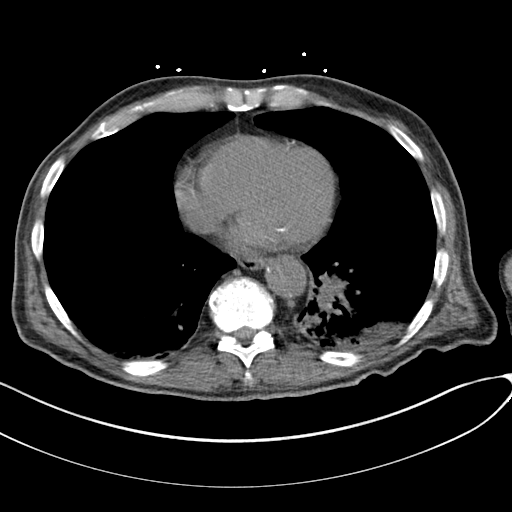

[Series 5: coronal st · coronal · 0.72mm/px · 3 of 131 slices shown]
[im 44/131  soft-tissue]
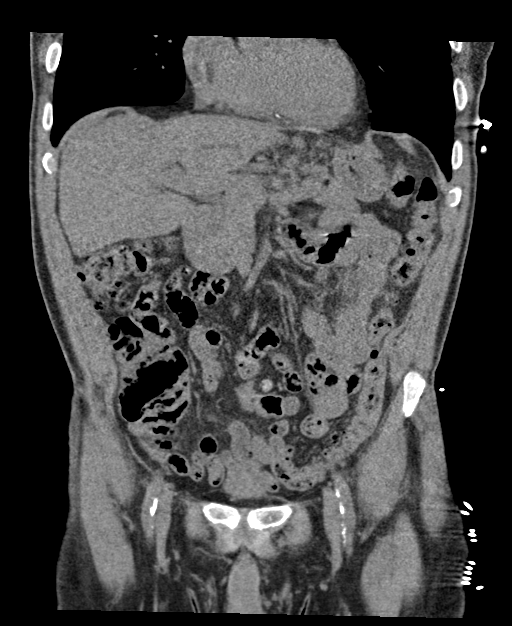
[im 58/131  soft-tissue]
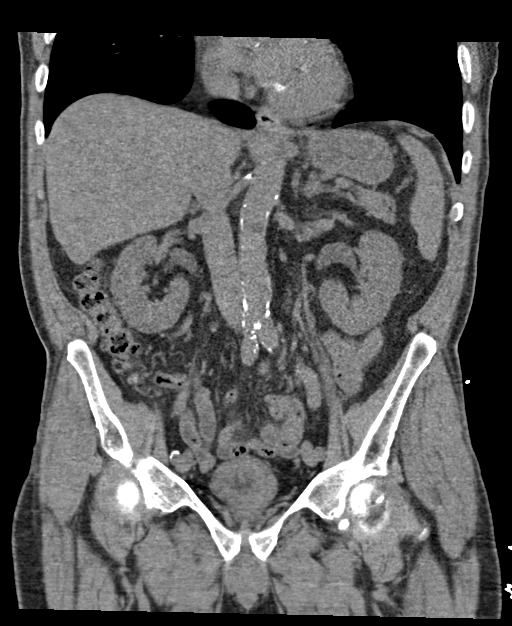
[im 73/131  soft-tissue]
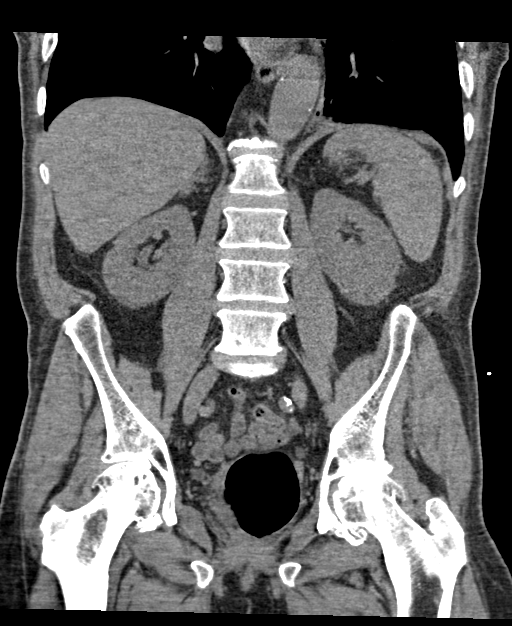

[16 of 46 positions shown; findings below may reference images not displayed]

FINDINGS: Lower chest: Patchy left lower lobe opacity, suspicious for
pneumonia. Mild right basilar opacity, atelectasis versus pneumonia.

Hepatobiliary: Unenhanced liver is unremarkable.

Layering small gallstones. No gallbladder wall thickening or
pericholecystic fluid. No intrahepatic or extrahepatic ductal
dilatation.

Pancreas: Within normal limits.

Spleen: Within normal limits.

Adrenals/Urinary Tract: Adrenal glands are within normal limits.

Kidneys are within normal limits. No renal, ureteral, or bladder
calculi. No hydronephrosis.

Irregularly thick-walled bladder (series 2/image 66), although
underdistended.

Stomach/Bowel: Stomach is within normal limits.

No evidence of bowel obstruction.

Appendix is not discretely visualized.

Colonic diverticulosis, without evidence of diverticulitis.

Vascular/Lymphatic: No evidence of abdominal aortic aneurysm.

Atherosclerotic calcifications of the abdominal aorta and branch
vessels.

No suspicious abdominopelvic lymphadenopathy.

Reproductive: Prostate is unremarkable.

Other: No abdominopelvic ascites.

Musculoskeletal: Mild degenerative changes of the visualized
thoracolumbar spine, most prominent at L5-S1.
IMPRESSION: Irregularly thick-walled bladder, although underdistended. Correlate
for cystitis.

No renal, ureteral, or bladder calculi. No hydronephrosis.

Patchy left lower lobe opacity, suspicious for pneumonia. Mild right
basilar opacity, likely atelectasis.

Cholelithiasis, without evidence of acute cholecystitis.

## 2023-03-25 IMAGING — CT CT HEAD W/O CM
3 series · 15 of 47 positions shown, 18 images · non-contrast
Comparison: 10/12/2012

CLINICAL DATA: Dizziness.  Unsteady gait.

EXAM:
CT HEAD WITHOUT CONTRAST
TECHNIQUE: Contiguous axial images were obtained from the base of the skull
through the vertex without intravenous contrast.

[Series 2: head wo · axial · 0.47mm/px · z∈[-150,-20]mm · 9 of 32 slices shown, 12 images]
[im 3/32  brain]
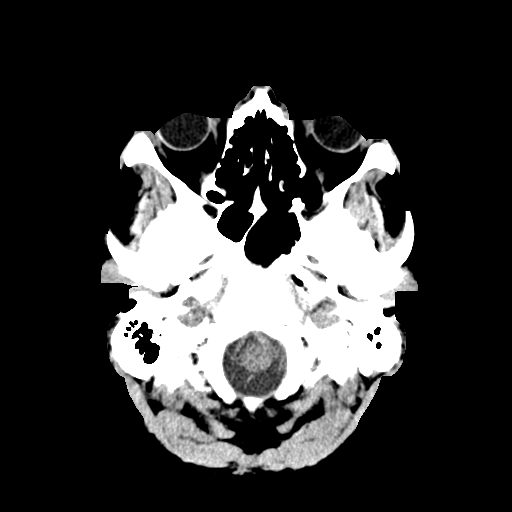
[im 3/32  bone]
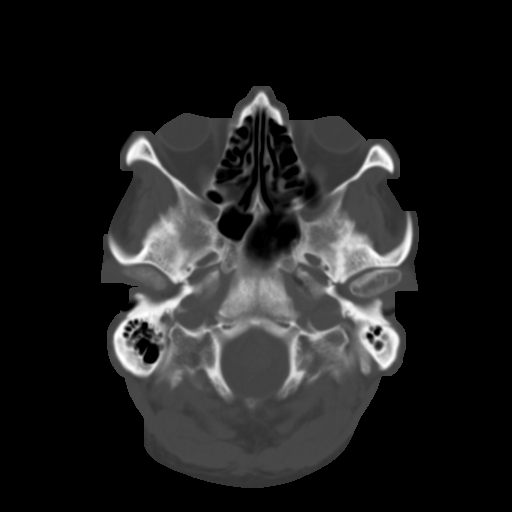
[im 6/32  brain]
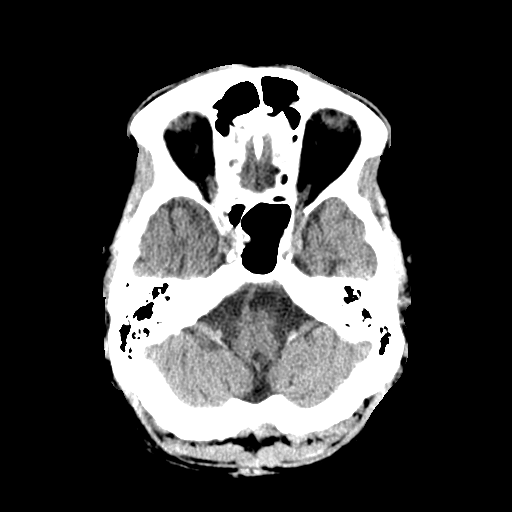
[im 9/32  brain]
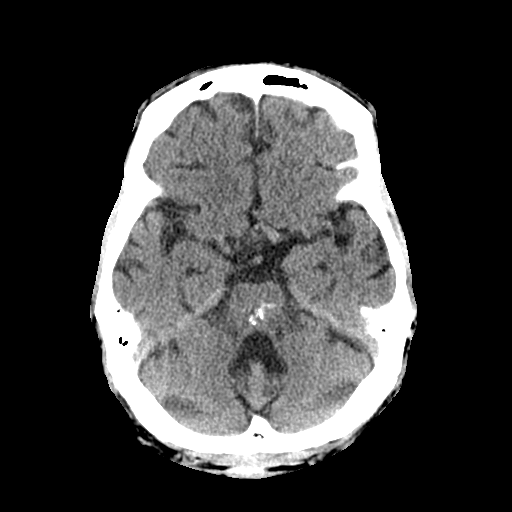
[im 12/32  brain]
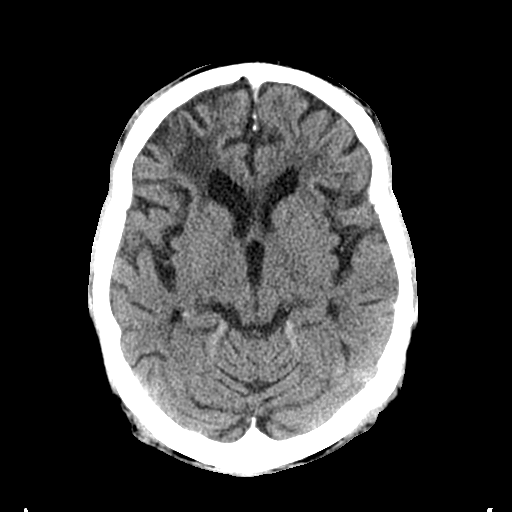
[im 17/32  brain]
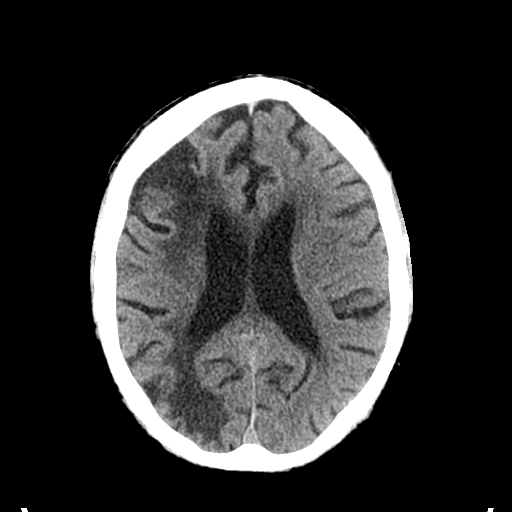
[im 17/32  bone]
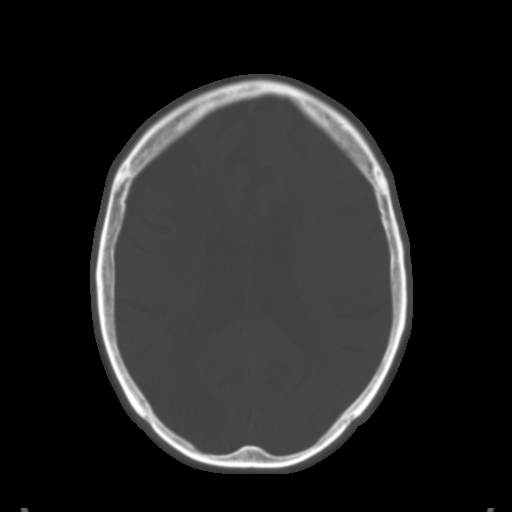
[im 20/32  brain]
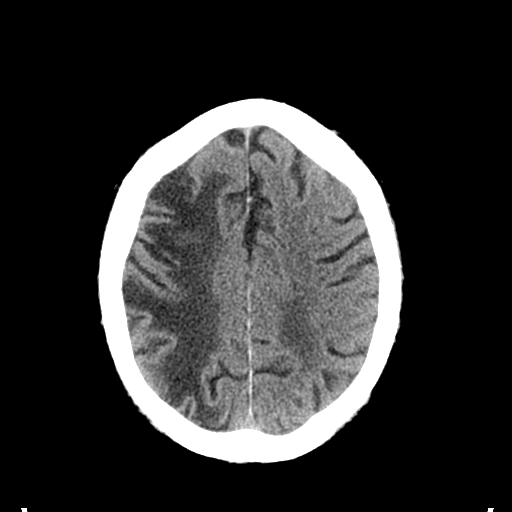
[im 23/32  brain]
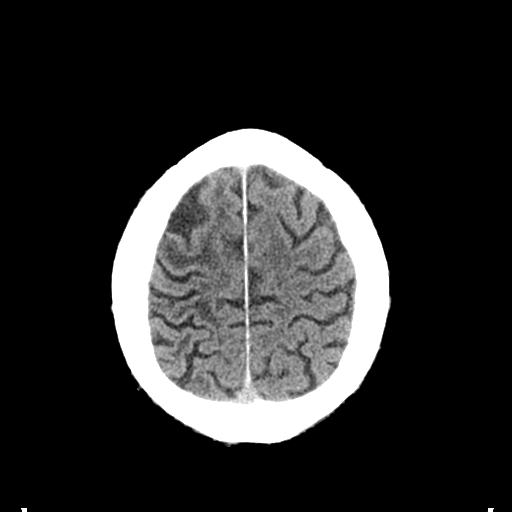
[im 26/32  brain]
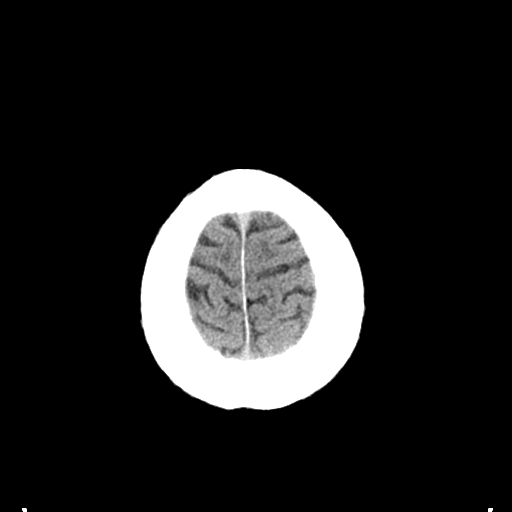
[im 29/32  brain]
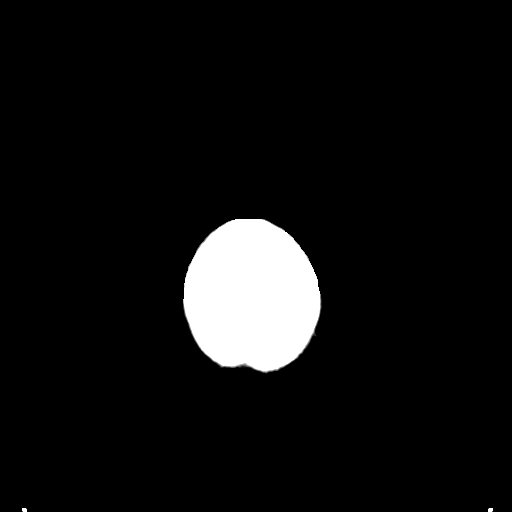
[im 29/32  bone]
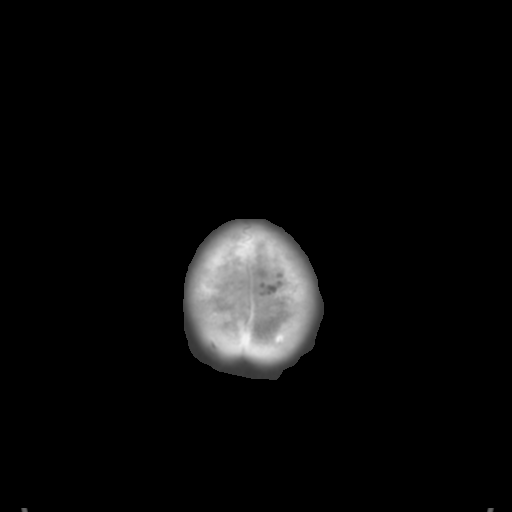

[Series 4: coronal soft tissue · coronal · 0.33mm/px · 3 of 68 slices shown]
[im 23/68  brain]
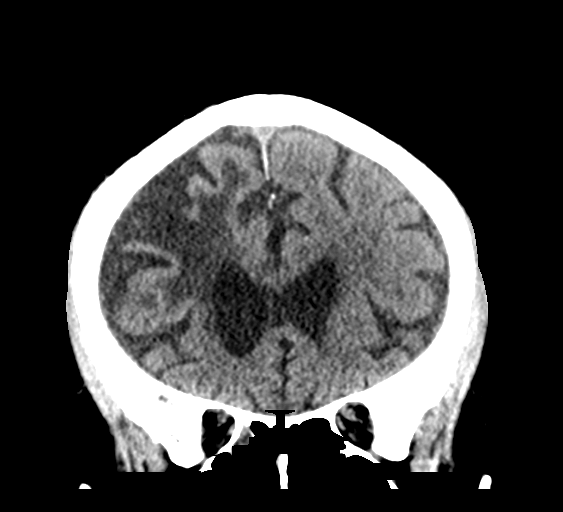
[im 30/68  brain]
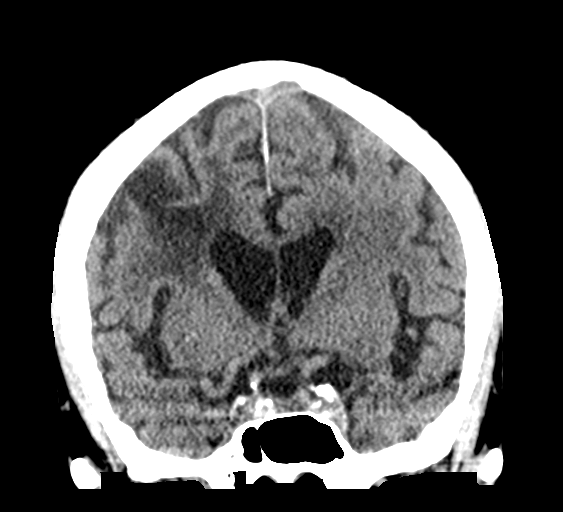
[im 38/68  brain]
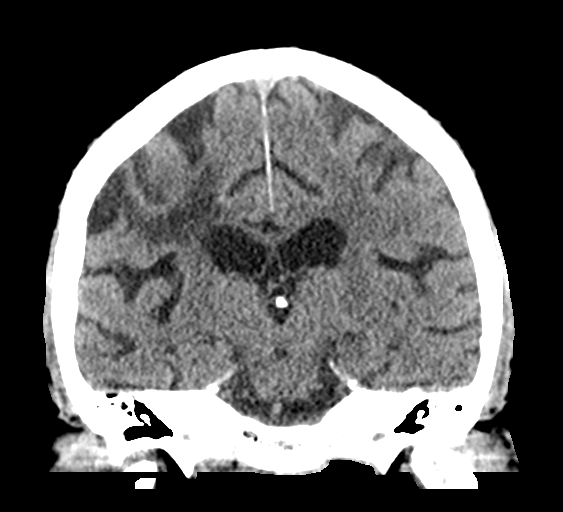

[Series 5: sagittal soft tissue · sagittal · 0.33mm/px · 3 of 58 slices shown]
[im 20/58  brain]
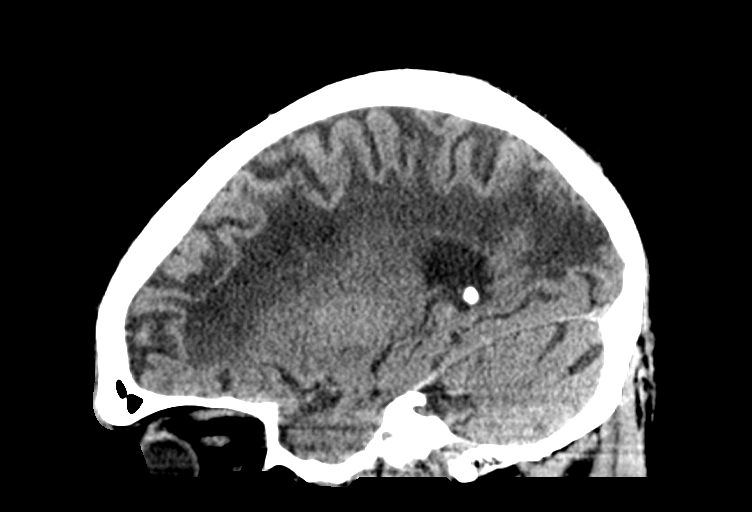
[im 29/58  brain]
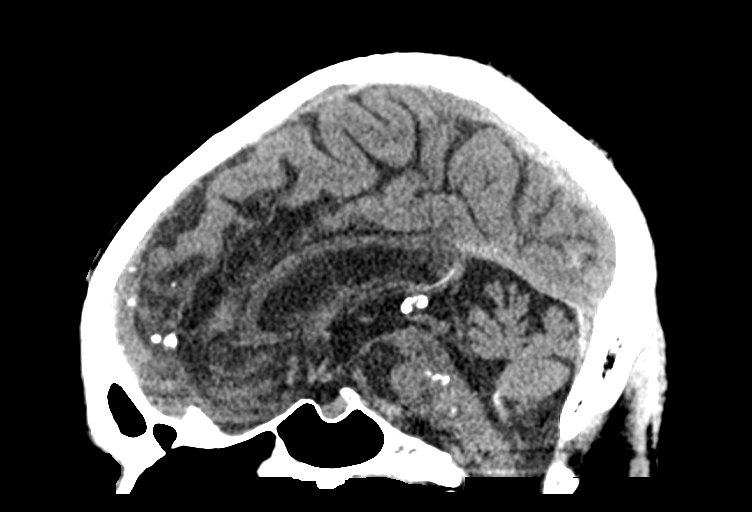
[im 39/58  brain]
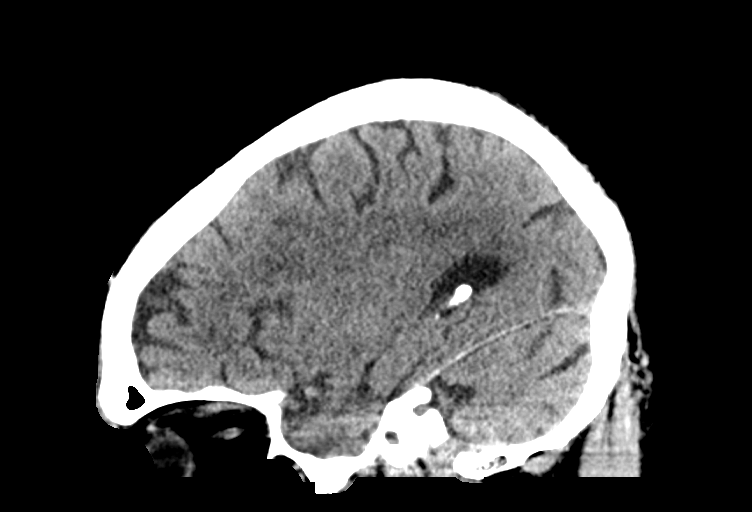

[15 of 47 positions shown; findings below may reference images not displayed]

FINDINGS: Brain: There is no evidence of an acute infarct, intracranial
hemorrhage, mass, midline shift, or extra-axial fluid collection.
Extensive encephalomalacia is again seen in the right MCA territory
related to a remote infarct. There is associated ex vacuo dilatation
of the right lateral ventricle. Hypodensities in the white matter of
the left cerebral hemisphere of mildly progressed and are
nonspecific but compatible with mild chronic small vessel ischemic
disease. There is chronic coarse/dystrophic calcification in the
pons which is stable to slightly increased without associated edema
or mass effect.

Vascular: Calcified atherosclerosis at the skull base. No hyperdense
vessel.

Skull: No fracture suspicious osseous lesion.

Sinuses/Orbits: Mild posterior right ethmoid air cell opacification.
Clear mastoid air cells. Unremarkable orbits.

Other: None.
IMPRESSION: 1. No evidence of acute intracranial abnormality.
2. Large remote right MCA infarct.
3. Mild chronic small vessel ischemic disease.
